# Patient Record
Sex: Male | Born: 1935 | Race: White | Hispanic: No | Smoking: Former smoker
Health system: Southern US, Community
[De-identification: ages and names within clinical notes are randomized; demographics above are authoritative.]

## PROBLEM LIST (undated history)

## (undated) DIAGNOSIS — I2510 Atherosclerotic heart disease of native coronary artery without angina pectoris: Secondary | ICD-10-CM

## (undated) DIAGNOSIS — R319 Hematuria, unspecified: Secondary | ICD-10-CM

## (undated) DIAGNOSIS — I219 Acute myocardial infarction, unspecified: Secondary | ICD-10-CM

## (undated) DIAGNOSIS — N400 Enlarged prostate without lower urinary tract symptoms: Secondary | ICD-10-CM

## (undated) DIAGNOSIS — E785 Hyperlipidemia, unspecified: Secondary | ICD-10-CM

## (undated) DIAGNOSIS — K219 Gastro-esophageal reflux disease without esophagitis: Secondary | ICD-10-CM

## (undated) DIAGNOSIS — R339 Retention of urine, unspecified: Secondary | ICD-10-CM

## (undated) DIAGNOSIS — M810 Age-related osteoporosis without current pathological fracture: Secondary | ICD-10-CM

## (undated) DIAGNOSIS — J449 Chronic obstructive pulmonary disease, unspecified: Secondary | ICD-10-CM

## (undated) DIAGNOSIS — S7292XA Unspecified fracture of left femur, initial encounter for closed fracture: Secondary | ICD-10-CM

## (undated) DIAGNOSIS — R972 Elevated prostate specific antigen [PSA]: Secondary | ICD-10-CM

## (undated) DIAGNOSIS — I10 Essential (primary) hypertension: Secondary | ICD-10-CM

## (undated) HISTORY — DX: Retention of urine, unspecified: R33.9

## (undated) HISTORY — DX: Acute myocardial infarction, unspecified: I21.9

## (undated) HISTORY — PX: CORONARY ANGIOPLASTY: SHX604

## (undated) HISTORY — DX: Benign prostatic hyperplasia without lower urinary tract symptoms: N40.0

## (undated) HISTORY — DX: Chronic obstructive pulmonary disease, unspecified: J44.9

## (undated) HISTORY — DX: Hyperlipidemia, unspecified: E78.5

## (undated) HISTORY — PX: HERNIA REPAIR: SHX51

## (undated) HISTORY — PX: ELBOW SURGERY: SHX618

## (undated) HISTORY — PX: APPENDECTOMY: SHX54

## (undated) HISTORY — PX: OTHER SURGICAL HISTORY: SHX169

## (undated) HISTORY — DX: Unspecified fracture of left femur, initial encounter for closed fracture: S72.92XA

## (undated) HISTORY — DX: Elevated prostate specific antigen (PSA): R97.20

## (undated) HISTORY — DX: Hematuria, unspecified: R31.9

## (undated) HISTORY — DX: Age-related osteoporosis without current pathological fracture: M81.0

## (undated) HISTORY — PX: CARDIAC CATHETERIZATION: SHX172

## (undated) HISTORY — DX: Atherosclerotic heart disease of native coronary artery without angina pectoris: I25.10

## (undated) HISTORY — DX: Gastro-esophageal reflux disease without esophagitis: K21.9

---

## 2004-01-22 HISTORY — PX: HIP SURGERY: SHX245

## 2004-07-26 ENCOUNTER — Ambulatory Visit: Payer: Self-pay | Admitting: General Practice

## 2004-08-09 ENCOUNTER — Ambulatory Visit: Payer: Self-pay | Admitting: Surgery

## 2004-08-09 ENCOUNTER — Other Ambulatory Visit: Payer: Self-pay

## 2004-08-17 ENCOUNTER — Ambulatory Visit: Payer: Self-pay | Admitting: Surgery

## 2004-09-27 ENCOUNTER — Inpatient Hospital Stay: Payer: Self-pay | Admitting: General Practice

## 2004-10-03 ENCOUNTER — Encounter: Payer: Self-pay | Admitting: General Practice

## 2004-10-21 ENCOUNTER — Encounter: Payer: Self-pay | Admitting: General Practice

## 2006-03-23 IMAGING — CR DG CHEST 2V
1 series · 2 of 2 positions shown · non-contrast
Comparison: none

REASON FOR EXAM: v13.27 pre op, 550.91 recurr inguinal hernia
COMMENTS:

[Series 743: postero_anterior · 0.11mm/px · 2 of 2 slices shown]
[im 1/2]
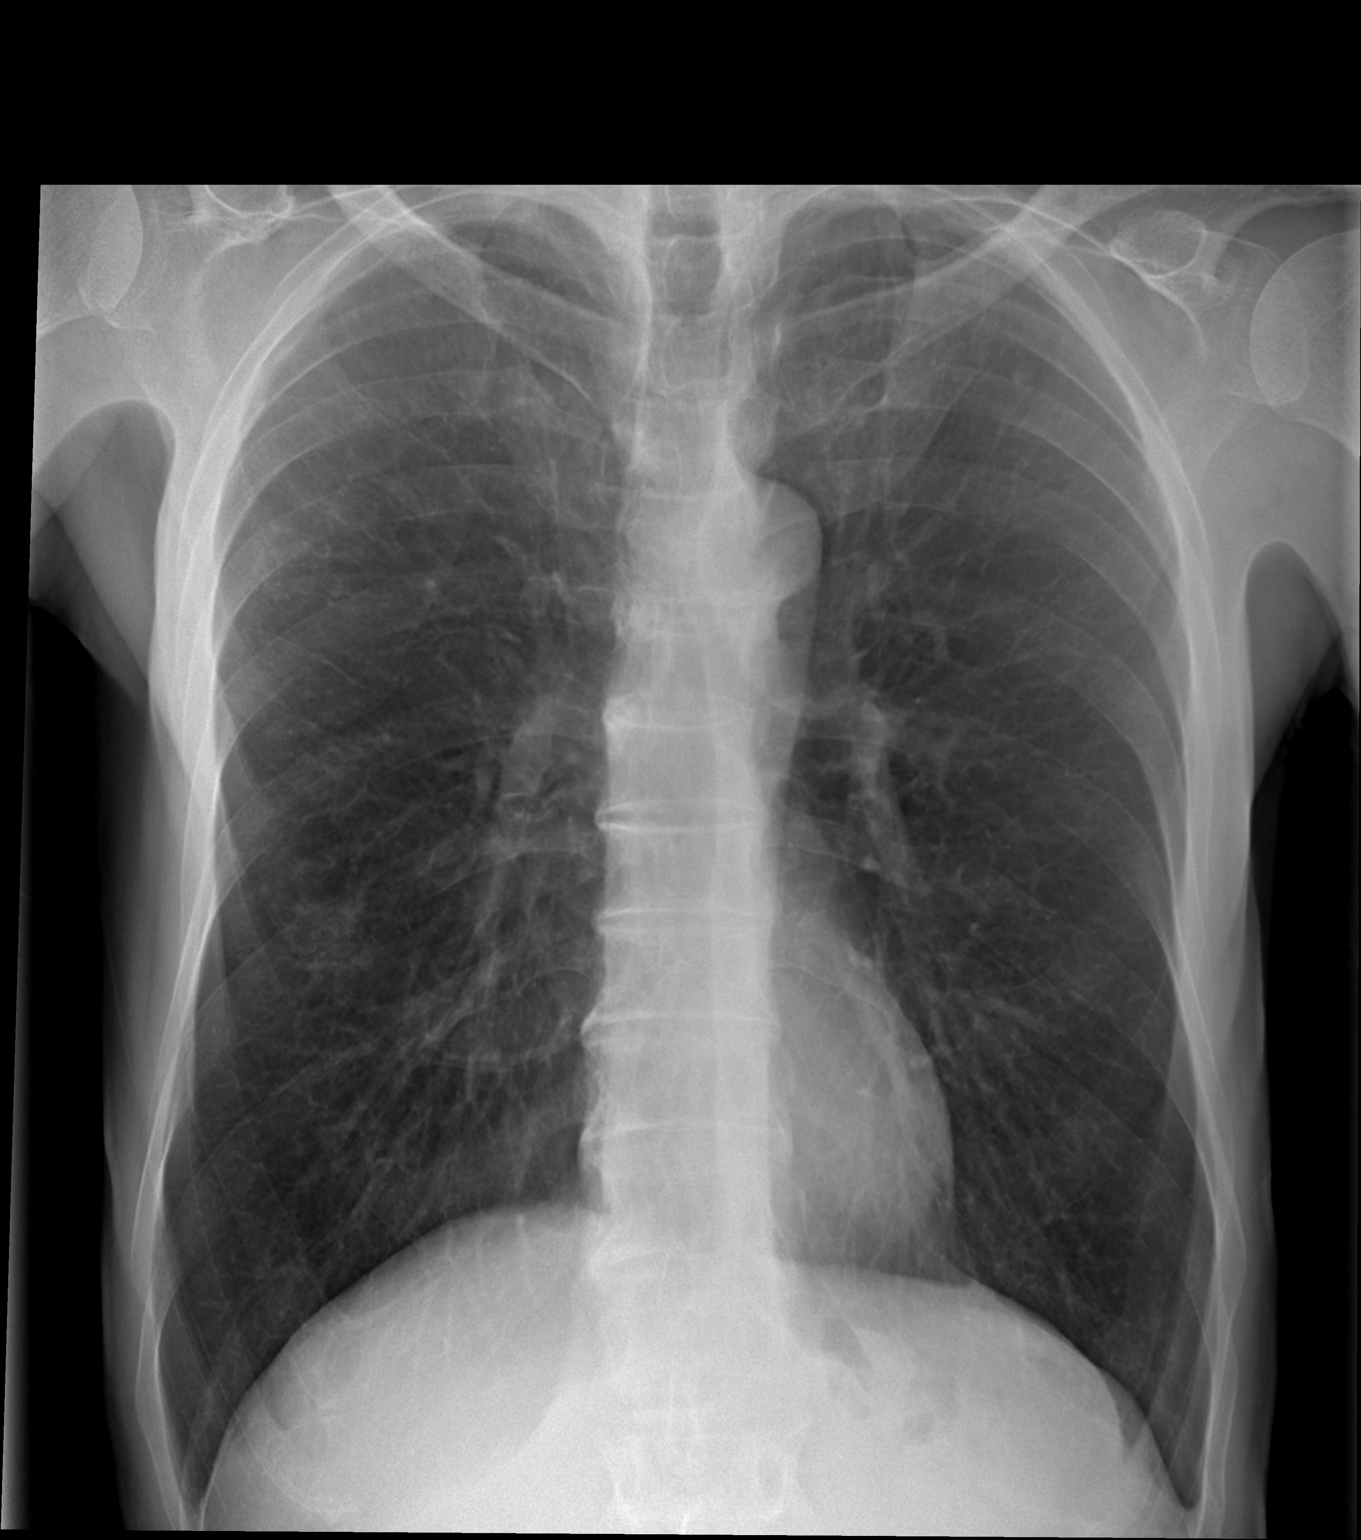
[im 2/2]
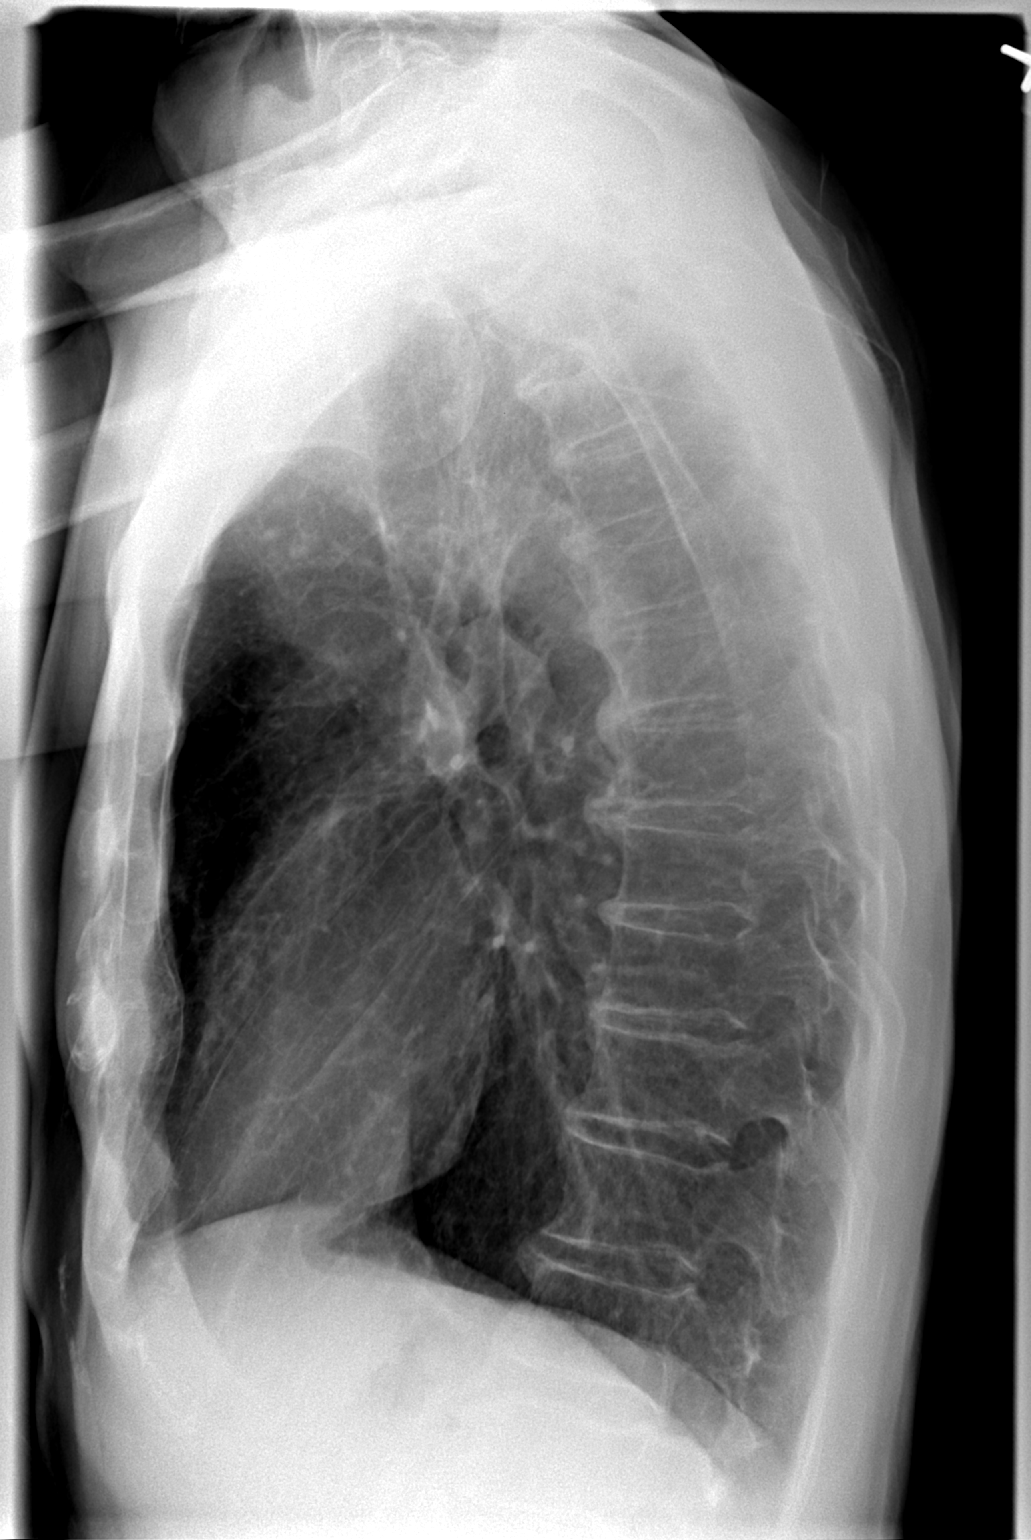

[2 of 2 positions shown; findings below may reference images not displayed]

PROCEDURE:     DXR - DXR CHEST PA (OR AP) AND LATERAL  - August 09, 2004  [DATE]

RESULT:     The patient shows evidence of underlying COPD and interstitial
changes.  The lungs are clear without evidence of infiltrates, effusions, or
masses.  Extensive degenerative bony changes are noted throughout the
thoracic spine.
IMPRESSION: COPD without superimposed acute abnormalities.

## 2010-10-21 ENCOUNTER — Emergency Department: Payer: Self-pay | Admitting: Emergency Medicine

## 2011-01-07 DIAGNOSIS — I2510 Atherosclerotic heart disease of native coronary artery without angina pectoris: Secondary | ICD-10-CM | POA: Insufficient documentation

## 2011-01-07 DIAGNOSIS — Z96649 Presence of unspecified artificial hip joint: Secondary | ICD-10-CM | POA: Insufficient documentation

## 2011-01-07 DIAGNOSIS — E785 Hyperlipidemia, unspecified: Secondary | ICD-10-CM | POA: Insufficient documentation

## 2013-02-20 ENCOUNTER — Inpatient Hospital Stay: Payer: Self-pay | Admitting: Internal Medicine

## 2013-02-20 ENCOUNTER — Ambulatory Visit: Payer: Self-pay | Admitting: Student

## 2013-02-20 LAB — URINALYSIS, COMPLETE
BILIRUBIN, UR: NEGATIVE
BLOOD: NEGATIVE
Bacteria: NONE SEEN
Glucose,UR: NEGATIVE mg/dL (ref 0–75)
Ketone: NEGATIVE
Leukocyte Esterase: NEGATIVE
Nitrite: NEGATIVE
PROTEIN: NEGATIVE
Ph: 7 (ref 4.5–8.0)
RBC,UR: <1 /HPF (ref 0–5)
Specific Gravity: 1.01 (ref 1.003–1.030)
Squamous Epithelial: NONE SEEN
WBC UR: <1 /HPF (ref 0–5)

## 2013-02-20 LAB — APTT: ACTIVATED PTT: 33.9 s (ref 23.6–35.9)

## 2013-02-20 LAB — BASIC METABOLIC PANEL
ANION GAP: 5 — AB (ref 7–16)
BUN: 12 mg/dL (ref 7–18)
CREATININE: 1.22 mg/dL (ref 0.60–1.30)
Calcium, Total: 8.2 mg/dL — ABNORMAL LOW (ref 8.5–10.1)
Chloride: 102 mmol/L (ref 98–107)
Co2: 30 mmol/L (ref 21–32)
EGFR (African American): >60
EGFR (Non-African Amer.): 53 — ABNORMAL LOW
Glucose: 87 mg/dL (ref 65–99)
OSMOLALITY: 273 (ref 275–301)
Potassium: 4.1 mmol/L (ref 3.5–5.1)
SODIUM: 137 mmol/L (ref 136–145)

## 2013-02-20 LAB — CBC
HCT: 37.7 % — ABNORMAL LOW (ref 40.0–52.0)
HGB: 12.7 g/dL — ABNORMAL LOW (ref 13.0–18.0)
MCH: 30.8 pg (ref 26.0–34.0)
MCHC: 33.7 g/dL (ref 32.0–36.0)
MCV: 91 fL (ref 80–100)
PLATELETS: 156 10*3/uL (ref 150–440)
RBC: 4.13 10*6/uL — ABNORMAL LOW (ref 4.40–5.90)
RDW: 12.3 % (ref 11.5–14.5)
WBC: 14.8 10*3/uL — ABNORMAL HIGH (ref 3.8–10.6)

## 2013-02-20 LAB — PROTIME-INR
INR: 1.1
Prothrombin Time: 13.6 secs (ref 11.5–14.7)

## 2013-02-21 ENCOUNTER — Ambulatory Visit: Payer: Self-pay | Admitting: Urology

## 2013-02-21 LAB — CBC WITH DIFFERENTIAL/PLATELET
BASOS ABS: 0.1 10*3/uL (ref 0.0–0.1)
Basophil %: 0.5 %
EOS ABS: 0.2 10*3/uL (ref 0.0–0.7)
Eosinophil %: 1.7 %
HCT: 41.7 % (ref 40.0–52.0)
HGB: 14 g/dL (ref 13.0–18.0)
LYMPHS ABS: 1.2 10*3/uL (ref 1.0–3.6)
LYMPHS PCT: 9.1 %
MCH: 30.9 pg (ref 26.0–34.0)
MCHC: 33.7 g/dL (ref 32.0–36.0)
MCV: 92 fL (ref 80–100)
MONO ABS: 1.1 x10 3/mm — AB (ref 0.2–1.0)
MONOS PCT: 8.4 %
Neutrophil #: 10.8 10*3/uL — ABNORMAL HIGH (ref 1.4–6.5)
Neutrophil %: 80.3 %
PLATELETS: 189 10*3/uL (ref 150–440)
RBC: 4.53 10*6/uL (ref 4.40–5.90)
RDW: 12.3 % (ref 11.5–14.5)
WBC: 13.4 10*3/uL — ABNORMAL HIGH (ref 3.8–10.6)

## 2013-02-22 LAB — BASIC METABOLIC PANEL
Anion Gap: 5 — ABNORMAL LOW (ref 7–16)
BUN: 15 mg/dL (ref 7–18)
CALCIUM: 8.2 mg/dL — AB (ref 8.5–10.1)
CREATININE: 1.21 mg/dL (ref 0.60–1.30)
Chloride: 104 mmol/L (ref 98–107)
Co2: 27 mmol/L (ref 21–32)
EGFR (African American): >60
EGFR (Non-African Amer.): 54 — ABNORMAL LOW
GLUCOSE: 108 mg/dL — AB (ref 65–99)
Osmolality: 273 (ref 275–301)
Potassium: 3.7 mmol/L (ref 3.5–5.1)
Sodium: 136 mmol/L (ref 136–145)

## 2013-02-22 LAB — HEMOGLOBIN: HGB: 11.6 g/dL — ABNORMAL LOW (ref 13.0–18.0)

## 2013-02-23 LAB — HEMOGLOBIN: HGB: 12.1 g/dL — ABNORMAL LOW (ref 13.0–18.0)

## 2014-03-15 ENCOUNTER — Emergency Department: Payer: Self-pay | Admitting: Internal Medicine

## 2014-05-14 NOTE — Discharge Summary (Signed)
PATIENT NAME:  Eric [name], Eric [name] MR#:  [id number] DATE OF BIRTH:  09-Jun-1923  DATE OF ADMISSION:  02/20/2013 DATE OF DISCHARGE:  02/23/2013    For [name] detailed note, please take [name] look at the history and physical done on admission by Dr. Imogene Burn.   DIAGNOSES AT DISCHARGE:  1.  Status post fall and oblique fracture of the left femur which is nonoperable.  2.  Urinary retention with hematuria status post endoscopic Foley catheter placement.  3.  Hypertension.  4.  Hyperlipidemia.  5.  Gastroesophageal reflux disease.   DIET: The patient is being discharged on [name] low-sodium, low-fat diet.   ACTIVITY: As tolerated.   FOLLOWUP: Dr. Trey Paula from urology in the next 1 week.   DISCHARGE MEDICATIONS: Lisinopril 5 mg daily, sublingual nitroglycerin as needed, metoprolol succinate 50 mg daily, Lipitor 20 mg daily, aspirin 81 mg daily, Norco 5/325, one to 2 tabs q.4-6 hours as needed, tramadol 50 mg one to 2 tabs q.4 hours as needed, enoxaparin  40 mg subcu daily x14 days, Tylenol 325 mg two tabs q.4 hours as needed, Senokot 1 tab b.i.d., Protonix 40 mg daily, ciprofloxacin 250 mg b.i.d. x5 days, and Flomax 0.4 mg daily.   CONSULTANTS DURING HOSPITAL COURSE: Dr. Adaline Sill from urology, Dr. Early Chars from orthopedics.   PERTINENT STUDIES DONE DURING THE HOSPITAL COURSE: An x-ray of the left femur done on 02/20/2013 showing osseous demineralization with postsurgical changes of the left total hip arthroplasty, nondisplaced, oblique fracture of the proximal left femur just below the level of the lesser trochanter.   [name] chest x-ray done on admission showing no acute disease.   HOSPITAL COURSE: This is an 81 year old male with medical problems as mentioned above who presented to the hospital on 02/20/2013 secondary to [name] fall and noted to have left hip pain.   1.  Status post fall and left hip pain. The patient had an x-ray on admission which showed an oblique fracture of the left hip. The patient was seen by  orthopedics who did not think that the patient needed surgical intervention but likely pain control and physical therapy. The patient has tolerated physical therapy here. They recommend short-term rehab which is where he is currently being discharged to. The patient will continue pain control for his hip pain with some Norco and tramadol as needed.  2.  Urinary retention. The patient presented to the hospital and [name] day after admission was having significant lower abdominal pain and was noted to have urinary retention. Multiple attempts were made to place [name] Foley catheter although it was not possible. [name] urology consult was obtained. The patient was seen by Dr. Selena Batten from urology. The patient underwent [name] cystoscopy and endoscopic Foley catheter placement which was on February 1. The patient's urine has been bloody since then although his hemoglobin has remained stable. At present, the patient is being discharged with his Foley to the skilled nursing facility. He should have [name] follow-up with [name]  urologist as an outpatient. He is being made [name] referral with Dr. Edwyna Shell. The patient likely has underlying benign prostatic hypertrophy. Therefore, he is being discharged on some Flomax and empiric antibiotics with Cipro for the next 5 days.  3.  Hypertension. The patient remained hemodynamically stable on his metoprolol and lisinopril. He will resume that.  4.  Hyperlipidemia. The patient was maintained on his Lipitor and he will resume that.  5.  Hematuria. This was likely secondary to urinary retention and benign prostatic hypertrophy. His  hemoglobin has remained stable. This further needs to be followed with urology as an outpatient.   CODE STATUS: FULL CODE.   DISPOSITION: He is being discharged to [name] skilled nursing facility for ongoing care.   TIME SPENT: 40 minutes.   ____________________________ Rolly PancakeVivek J. Cherlynn KaiserSainani, MD vjs:np D: 02/23/2013 14:41:43 ET T: 02/23/2013 15:01:51 ET JOB#: [id number]  cc: Rolly PancakeVivek J.  Cherlynn KaiserSainani, MD, <Dictator> Richard D. Edwyna ShellHart, DO [place] SirenVIVEK J Angellica Maddison MD ELECTRONICALLY SIGNED 02/25/2013 11:51

## 2014-05-14 NOTE — Op Note (Signed)
PATIENT NAME:  Eric [name], Eric [name] MR#:  [id number] DATE OF BIRTH:  1923/09/21  DATE OF PROCEDURE:  02/21/2013  PREOPERATIVE DIAGNOSES: 1.  Urinary retention. 2.  Benign prostatic hyperplasia. 3.  Urethral stricture.  POSTOPERATIVE DIAGNOSES: 1.  Urinary retention.  2.  Benign prostatic hyperplasia.  3.  Urethral stricture.   PROCEDURES: 1.  Flexible cystourethroscopy with endoscopic-assisted Foley catheter placement.  2.  Bladder irrigation.   ANESTHESIA:  Intravenous anesthesia.   SURGEON: Marin OlpJay H. Karne Ozga, M.D.   ESTIMATED BLOOD LOSS: Minimal.   COMPLICATIONS: None.   DRAINS: [name] 16 [place] catheter to straight drainage.    PATHOLOGY SPECIMENS:  None.   DESCRIPTION OF PROCEDURE: The patient was brought to the cystoscopy suite and after intravenous anesthesia was established, the patient was maintained in the supine position, given his nondisplaced left proximal femur fracture. The patient was then prepped and draped in the usual sterile fashion. The 16 French flexible cystoscope was then inserted per urethra utilizing normal saline as the irrigant. There was [name] mild bulbar urethral stricture appreciated. The flexible scope cystoscope was able to be advanced through the stricture and the membranous urethra into the prostate fossa. There was severe occlusive bilobar prostatic enlargement with [name] tortuous prostate fossa, along with elevation of the prostate floor and bladder neck. The flexible cystoscope was able to be successfully negotiated through the tortuous prostatic fossa into the bladder lumen. Grossly bloody urine was appreciated within the bladder lumen and was enough to obscure complete visualization of the interior of the bladder. [name] super stiff guidewire was then advanced through the flexible cystoscope and into the bladder lumen. The flexible cystoscope was then withdrawn. [name] 16 [place] catheter was then advanced over the super stiff guidewire and into the bladder lumen without  difficulty. The bladder was inflated with 15 mL of  sterile water. The super stiff guidewire was withdrawn. The Foley catheter was placed to straight drainage. After an initial 100 mL of grossly bloody urine was drained, the catheter stopped draining. As such, the Foley catheter was then irrigated, retrieving [name] clot from the bladder. [name] total of 300 mL of sterile water irrigation was required to completely evacuate all clots in the bladder. The bladder effluent was light pink after irrigation, consistent with the absence of any significant ongoing hematuria. As such, the catheter was placed to straight drainage and an additional 300 mL of urine drained. The patient was then transferred to the postanesthesia care unit in stable condition, having tolerated the procedure well.   RECOMMENDATIONS: 1.  Keep the urethral Foley catheter for [name] minimum of one week and until the patient is fully ambulatory.  2.  Upon discussion with the patient's daughter, the [facility], having seen Dr. Leonette MonarchHarman approximately two years ago. [facility].     ____________________________ Marin OlpJay H. Ketara Cavness, MD jhk:NTS D: 02/21/2013 05:37:06 ET T: 02/21/2013 06:08:07 ET JOB#: [id number]  cc: Marin OlpJay H. Briany Aye, MD, <Dictator> Marin OlpJAY H Zenia Guest MD ELECTRONICALLY SIGNED 03/12/2013 22:35

## 2014-05-14 NOTE — Consult Note (Signed)
Admit Reason:   Hip pain (719.45): Status: Active, Coding System: ICD9, Coded Name: Pain in joint, pelvic region and thigh    osteoporosis:   Home Medications: Medication Instructions Status  lisinopril 5 mg oral tablet 1 tab(s) orally once a day Active  nitroglycerin 0.4 mg sublingual tablet 1 tab(s) sublingual every 5 minutes, As Needed - for Chest Pain Active  aspirin 81 mg oral tablet 1 tab(s) orally once a day Active  Metoprolol Succinate ER 50 mg oral tablet, extended release 1 tab(s) orally once a day (in the morning) Active   Lab Results:  Routine Chem:  31-Jan-15 13:50   Glucose, Serum 87  BUN 12  Creatinine (comp) 1.22  Potassium, Serum 4.1  Chloride, Serum 102  CO2, Serum 30  Calcium (Total), Serum  8.2  Anion Gap  5  Osmolality (calc) 273  eGFR (African American) >60  eGFR (Non-African American)  53 (eGFR values <72m/min/1.73 m2 may be an indication of chronic kidney disease (CKD). Calculated eGFR is useful in patients with stable renal function. The eGFR calculation will not be reliable in acutely ill patients when serum creatinine is changing rapidly. It is not useful in  patients on dialysis. The eGFR calculation may not be applicable to patients at the low and high extremes of body sizes, pregnant women, and vegetarians.)  Routine Coag:  31-Jan-15 13:50   Prothrombin 13.6  INR 1.1 (INR reference interval applies to patients on anticoagulant therapy. A single INR therapeutic range for coumarins is not optimal for all indications; however, the suggested range for most indications is 2.0 - 3.0. Exceptions to the INR Reference Range may include: Prosthetic heart valves, acute myocardial infarction, prevention of myocardial infarction, and combinations of aspirin and anticoagulant. The need for a higher or lower target INR must be assessed individually. Reference: The Pharmacology and Management of the Vitamin K  antagonists: the seventh ACCP Conference  on Antithrombotic and Thrombolytic Therapy. CASNKN.3976Sept:126 (3suppl): [id number] A HCT value >55% may artifactually increase the PT.  In one study,  the increase was an average of 25%. Reference:  "Effect on Routine and Special Coagulation Testing Values of Citrate Anticoagulant Adjustment in Patients with High HCT Values." American Journal of Clinical Pathology 2006;126:400-405.)  Activated PTT (APTT) 33.9 (A HCT value >55% may artifactually increase the APTT. In one study, the increase was an average of 19%. Reference: "Effect on Routine and Special Coagulation Testing Values of Citrate Anticoagulant Adjustment in Patients with High HCT Values." American Journal of Clinical Pathology 2006;126:400-405.)  Routine Hem:  31-Jan-15 13:50   WBC (CBC)  14.8  RBC (CBC)  4.13  Hemoglobin (CBC)  12.7  Hematocrit (CBC)  37.7  Platelet Count (CBC) 156 (Result(s) reported on 20 Feb 2013 at 02:16PM.)  MCV 91  MCH 30.8  MCHC 33.7  RDW 12.3   Radiology Results:  Radiology Results: XRay:    31-Jan-15 11:53, Femur Left  Femur Left  REASON FOR EXAM:    Fall, pain  COMMENTS:       PROCEDURE: DXR - DXR FEMUR LEFT  - Feb 20 2013 11:53AM     CLINICAL DATA:  FGolden Circleyesterday, continued pain with weight-bearing  on left side    EXAM:  LEFT FEMUR - 2 VIEW    COMPARISON:  None    FINDINGS:  Visualized left hemipelvis appears intact.  Components of left hip prosthesis in expected positions.    Diffuse osseous demineralization.    Degenerative changes right knee with joint space narrowing.  Nondisplaced oblique fracture at proximal left femur just below the  level lesser trochanter, visualized only on frog-leg lateral view.    No additional fracture or dislocation.     IMPRESSION:  Osseous demineralization with postsurgical changes of left total hip  arthroplasty.  Nondisplaced oblique fracture proximal left femur just below the  level of the lesser  trochanter.      Electronically Signed    By: Lavonia Dana M.D.    On: 02/20/2013 12:04         Verified By: Burnetta Sabin, M.D.,    31-Jan-15 11:53, Pelvis AP Only  Pelvis AP Only  REASON FOR EXAM:    Fall  COMMENTS:       PROCEDURE: DXR - DXR PELVIS AP ONLY  - Feb 20 2013 11:53AM     CLINICAL DATA:  [place] yesterday, continued pain with weight-bearing  especially left side    EXAM:  PELVIS - 1-2 VIEW    COMPARISON:  None    FINDINGS:  Left hip prosthesis with components in expected positions.  Degenerative changes right hip with joint space narrowing and spur  formation.    Osseous demineralization.    No acute fracture, dislocation or bone destruction.     IMPRESSION:  Osseous demineralization.    Left hip prosthesis and degenerative changes right hip.    No acute abnormalities.    Electronically Signed    By: Lavonia Dana M.D.    On: 02/20/2013 12:00         Verified By: Burnetta Sabin, M.D.,  LabUnknown:    31-Jan-15 11:53, Femur Left  PACS Image    31-Jan-15 11:53, Pelvis AP Only  PACS Image    Eggs: Unknown   General Aspect 81 y/o male patient in no acute distress.   Present Illness 81 y/o caucasian male who fell in yard earlier today. Noted immediate pain, states it took him some time to get up but was able to ambulate post fall. Pain radiating down femur. Only bothers him with movement and weight-bearing, minimal to no pain at rest.   Case History and Physical Exam:  Chief Complaint hip pain after fall   Past Medical Health Coronary Artery Disease, Hypertension, COPD, osteoporosis   Past Surgical History Appendectomy   Family History Non-Contributory   HEENT EOM intact, head atraumatic, wears glasses   Neck/Nodes Supple   Chest/Lungs No use of accessory muscles, non-labored breathing   Breasts Not examined   Cardiovascular Normal Sinus Rhythm   Abdomen Benign   Genitalia Not examined   Rectal Not examined   Musculoskeletal TTP  greater troch area, pain with hip ROM. SILT DP/SP/sural/saphenous distribution. EHL/FHL/GCS/TA intact. Extremity  WWP, good distal pulses.  No open wounds, well healed scar at hip from prior surgery.   Neurological Grossly WNL   Skin Warm  Dry    Impression 81 y/o white male after fall in yard with non-dispalced periprosthetic proximal femur fracture   Plan Fully-coated hip stem without evidence of pedestal formation but some radiolucent lines around stem. No evidence of obvious stem subsidence. Non-displaced proximal femur frature just distal to greater trochanter.   - will attempt non-operative treatment - HIP ABDUCTION RESTRICTIONS and TOUCH DOWN WEIGHTBEARING, if he is unable to follow will need abduction brace - pain control - mobilize with PT as tolerated, needs DME assessment and walker - will need endocrinology work-up for osteoporosis and appropriate management by endocrinology - would start on Vitamin D and Calcium citrate today -  may need SNF placement - care plan discussed with family at bedside, reviewed xrays together - Lovenox for DVT ppx  - will discuss with Dr. Marry Guan who did primary surgery   Electronic Signatures: Harle Battiest (MD)  (Signed 31-Jan-15 16:18)  Authored: Health Issues, Significant Events - History, Home Medications, Labs, Radiology Results, Allergies, General Aspect/Present Illness, History and Physical Exam, Impression/Plan   Last Updated: 31-Jan-15 16:18 by Harle Battiest (MD)

## 2014-05-14 NOTE — H&P (Signed)
PATIENT NAME:  Eric Eric, Eric Eric MR#:  [id number] DATE OF BIRTH:  04/09/1923  DATE OF ADMISSION:  02/20/2013  PRIMARY CARE PHYSICIAN:  None local.  REFERRING PHYSICIAN:  Dr. Scotty [name].   CHIEF COMPLAINT:  Fall by accident today with left hip pain.   HISTORY OF PRESENT ILLNESS:  An 81 year old Caucasian male with Eric history of hypertension, COPD, osteoporosis, presented in the ED with above chief complaint. The patient fell by accident early this morning, complains of left hip pain, unable to bear weight.   The patient denies any syncope, loss of consciousness. No headache or dizziness. Denies any other injury. The patient's only complains of mild cough recently, denies any chest pain, palpitations, orthopnea, nocturnal dyspnea. No  nausea, vomiting, diarrhea. No urine problem.   Dr. Scotty [name] talked with Dr. Ernest [name], who did Eric left hip arthropathy in 2006. Dr. Ernest [name] suggested to admit the patient, got pain control, Physical Therapy evaluation and the patient needs rehab replacement.   PAST MEDICAL HISTORY:  1.  Hypertension.  2.  COPD.  3.  Osteoporosis. 4.  CAD status post Eric stent   PAST SURGICAL HISTORY:  Status post left hip surgery in 2006.   SOCIAL HISTORY:  Smokes Eric pipe occasionally, no alcohol drinking or illicit drugs.   FAMILY HISTORY:  Father had Eric heart attack. Mother had Eric stroke.   ALLERGIES:  EGGS.   HOME MEDICATIONS: 1.  Nitroglycerin 0.4 mg sublingual 1 tab every 5 minutes p.r.n. for chest pain.  2.  Lopressor 50 mg p.o. daily.  3.  Lisinopril 5 mg p.o. daily.  4.  Aspirin 81 mg p.o. daily.   REVIEW OF SYSTEMS:  CONSTITUTIONAL:  Denies any fever or chills. No headache or dizziness. No weakness. No weight gain, weight loss.  EYES:  No double vision or blurred vision,  ENT:  No postnasal drip, slurred speech or dysphagia.  CARDIOVASCULAR:  No chest pain, palpitation, orthopnea, nocturnal dyspnea. No leg edema.  PULMONARY:  Mild cough, no shortness of breath, wheezing.  No hemoptysis.  GASTROINTESTINAL:  No abdominal pain, nausea, vomiting or diarrhea. No melena or bloody stool. GENITOURINARY:  No dysuria, hematuria or incontinence.  SKIN:  No rash or jaundice.  NEUROLOGIC:  Eric and O.  HEMATOLOGY:  No easy bruising or bleeding.  ENDOCRINE:  No polyuria, polydipsia, heat or cold intolerance.   PHYSICAL EXAMINATION:  VITAL SIGNS:  Temperature 99.3, blood pressure 141/63, pulse 90, O2 saturation 96% in room air.  GENERAL:  Alert, awake, oriented, in no acute distress.  HEENT:  Pupils round, equal and reactive to light and accommodation. Moist oral mucosa. Clear oropharynx.  NECK:  Supple. No JVD or carotid bruit. No lymphadenopathy. No thyromegaly.  CARDIOVASCULAR:  S1, S2 regular rate and rhythm. No murmurs, gallop,  PULMONARY:  Bilateral air entry. No wheezing or rales. No use of accessory muscle to breathe.  ABDOMEN:  Soft. No distention or tenderness. No organomegaly. Bowel sounds present.  EXTREMITIES:  No edema, clubbing or cyanosis. No calf tenderness. Left hip tenderness, unable to move left lower extremity.  SKIN:  No rash or jaundice.  NEUROLOGIC:  Eric and O x 3. No focal deficit. Power 5/5. Sensation intact.   LABORATORY DATA:  Chest x-ray:  No active disease. Glucose 87, BUN 12, creatinine 1.22, sodium 137, potassium 4.1, chloride 102. WBC 14.8, hemoglobin 12.7, platelets 156. Left femur x-ray shows status post surgical changes of the left total hip arthroplasty, nondisplaced oblique fracture. AP x-ray showed left hip prothesis, degenerative  changes on the right hip.   IMPRESSIONS: 1.  Left hip fracture.  2.  Fall. 3.  Hypertension.  4.  Chronic obstructive pulmonary disease.  5.  Osteoporosis.  6.  Coronary artery disease.   PLAN OF TREATMENT:  1.  The patient will be admitted to Eric [facility]. We will start pain control, physical therapy evaluation and fall precautions. We will get an orthopaedic surgery consult from Dr. Ernest PineHooten.  2.  We  will continue the patient's home hypertension medication, Lopressor and lisinopril and continue aspirin.   I discussed the patient's condition and plan of treatment with the patient, the patient's daughter.   CODE STATUS:  The patient wants FULL CODE.   TIME SPENT: About 50 minutes   ____________________________ Eric PollackQing Tymeka Privette, MD qc:jm D: 02/20/2013 14:49:31 ET T: 02/20/2013 15:20:24 ET JOB#: [id number]  cc: Eric PollackQing Ghina Bittinger, MD, <Dictator> Eric PollackQING Eric Canner MD ELECTRONICALLY SIGNED 02/21/2013 15:29

## 2014-05-14 NOTE — Consult Note (Signed)
Chief Complaint:  Subjective/Chief Complaint Urology F/U - Post-op Check s/p Flexible Cysto/Endoscopic-assisted Foley Placement  Pt sleeping comfortably - arousable - denies any pain or discomfort.   VITAL SIGNS/ANCILLARY NOTES: **Vital Signs.:   01-Feb-15 09:41  Temperature Temperature (F) 97.7  Celsius 36.5  Temperature Source oral  Pulse Pulse 83  Respirations Respirations 22  Systolic BP Systolic BP 578  Diastolic BP (mmHg) Diastolic BP (mmHg) 65  Mean BP 77  Pulse Ox % Pulse Ox % 98  Pulse Ox Activity Level  At rest  Oxygen Delivery 2L  *Intake and Output.:   Daily 01-Feb-15 07:00  Grand Totals Intake:  250 Output:  525    Net:  -42 24 Hr.:  -275  Oral Intake      In:  0  Total Intake per OR/Specials/[facility]      In:  250  Urine ml     Out:  100  EBL ml     Out:  25  Total Output per OR/Specials/[facility] ml     Out:  400  Length of Stay Totals Intake:  250 Output:  525    Net:  -275   Brief Assessment:  GEN well developed, no acute distress, thin, sleeping comfortably - arousable, responds to questions appropriately   Additional Physical Exam Foley catheter in place - draining clear amber urine. Foreskin noted to be retracted behind the glans - reduced without difficulty.   Lab Results: Routine BB:  31-Jan-15 13:50   ABO Group + Rh Type B Positive  Antibody Screen NEGATIVE (Result(s) reported on 20 Feb 2013 at 03:15PM.)  Routine Chem:  31-Jan-15 13:50   Glucose, Serum 87  BUN 12  Creatinine (comp) 1.22  Sodium, Serum 137  Potassium, Serum 4.1  Chloride, Serum 102  CO2, Serum 30  Calcium (Total), Serum  8.2  Anion Gap  5  Osmolality (calc) 273  eGFR (African American) >60  eGFR (Non-African American)  53 (eGFR values <35m/min/1.73 m2 may be an indication of chronic kidney disease (CKD). Calculated eGFR is useful in patients with stable renal function. The eGFR calculation will not be reliable in acutely ill patients when serum creatinine  is changing rapidly. It is not useful in  patients on dialysis. The eGFR calculation may not be applicable to patients at the low and high extremes of body sizes, pregnant women, and vegetarians.)  Routine UA:  31-Jan-15 19:53   Color (UA) Yellow  Clarity (UA) Clear  Glucose (UA) Negative  Bilirubin (UA) Negative  Ketones (UA) Negative  Specific Gravity (UA) 1.010  Blood (UA) Negative  pH (UA) 7.0  Protein (UA) Negative  Nitrite (UA) Negative  Leukocyte Esterase (UA) Negative (Result(s) reported on 20 Feb 2013 at 08:09PM.)  RBC (UA) <1 /HPF  WBC (UA) <1 /HPF  Bacteria (UA) NONE SEEN  Epithelial Cells (UA) NONE SEEN  Mucous (UA) PRESENT (Result(s) reported on 20 Feb 2013 at 08:09PM.)  Routine Coag:  31-Jan-15 13:50   Prothrombin 13.6  INR 1.1 (INR reference interval applies to patients on anticoagulant therapy. A single INR therapeutic range for coumarins is not optimal for all indications; however, the suggested range for most indications is 2.0 - 3.0. Exceptions to the INR Reference Range may include: Prosthetic heart valves, acute myocardial infarction, prevention of myocardial infarction, and combinations of aspirin and anticoagulant. The need for a higher or lower target INR must be assessed individually. Reference: The Pharmacology and Management of the Vitamin K  antagonists: the seventh ACCP Conference on Antithrombotic  and Thrombolytic Therapy. KYHCW.2376 Sept:126 (3suppl): [id number]. A HCT value >55% may artifactually increase the PT.  In one study,  the increase was an average of 25%. Reference:  "Effect on Routine and Special Coagulation Testing Values of Citrate Anticoagulant Adjustment in Patients with High HCT Values." American Journal of Clinical Pathology 2006;126:400-405.)  Activated PTT (APTT) 33.9 (A HCT value >55% may artifactually increase the APTT. In one study, the increase was an average of 19%. Reference: "Effect on Routine and Special Coagulation  Testing Values of Citrate Anticoagulant Adjustment in Patients with High HCT Values." American Journal of Clinical Pathology 2006;126:400-405.)  Routine Hem:  31-Jan-15 13:50   WBC (CBC)  14.8  RBC (CBC)  4.13  Hemoglobin (CBC)  12.7  Hematocrit (CBC)  37.7  Platelet Count (CBC) 156 (Result(s) reported on 20 Feb 2013 at 02:16PM.)  MCV 91  MCH 30.8  MCHC 33.7  RDW 12.3  01-Feb-15 04:11   WBC (CBC)  13.4  RBC (CBC) 4.53  Hemoglobin (CBC) 14.0  Hematocrit (CBC) 41.7  Platelet Count (CBC) 189  MCV 92  MCH 30.9  MCHC 33.7  RDW 12.3  Neutrophil % 80.3  Lymphocyte % 9.1  Monocyte % 8.4  Eosinophil % 1.7  Basophil % 0.5  Neutrophil #  10.8  Lymphocyte # 1.2  Monocyte #  1.1  Eosinophil # 0.2  Basophil # 0.1 (Result(s) reported on 21 Feb 2013 at 05:43AM.)   Assessment/Plan:  Assessment/Plan:  Assessment 1. Urinary Retention s/p Fall with Left Femur Fracture -multi-factorial (severe, anatomic BPH; acute pain with left femur fx; immobility s/p left femur fx) -foley was unable to be placed at the bedside requiring flexible cystourethroscopy with endoscopic-assisted foley placement under anesthesia  2. BPH (see #2) -severe, occlusive bilobar BPH with tortuous prostate fossa and marked elevation of the prostate floor/bladder neck  3. Gross Hematuria s/p Complicated Foley Placement (see #1) -resolved off lovenox, baby ASA   Plan 1. Keep foley catheter for a minimum of one week AND until pt is fully ambulatory (please do not remove without Urology clearance).  2. Tamsulosin 0.100m po qDay after the same meal.  [facility] - the family requests f/u with Dr. HOrlie Pollengroup.   Electronic Signatures: KDarcella Cheshire(MD)  (Signed 01-Feb-15 11:43)  Authored: Chief Complaint, VITAL SIGNS/ANCILLARY NOTES, Brief Assessment, Lab Results, Assessment/Plan   Last Updated: 01-Feb-15 11:43 by KDarcella Cheshire(MD)

## 2014-05-14 NOTE — Consult Note (Signed)
Urology Consultation Report/Procedure Note: for Consultation: Urinary Retention with inability to place a foley catheter MD: Dr. Harle StanfordElgergawyMD: Marin OlpJay H. Estrella Alcaraz, M.D. 81 y.o. WM admitted s/p fall resulting in a left hip fracture.  Pt has only been able to void small amounts since admission.  Bladder Scan PVR >56000mL.  Foley unable to be placed by nursing nor Dr. Randol KernElgergawy.  Urology consulted for difficult foley placement. Pt denies prior h/o of LUTS or GU surgery. NOTE:  Attempted foley placement/Attempted filiform placement/Bedside Flexible Cystoscopy prepped and draped sterilely.  Urethra distended with 10mL of sterile lubrifax. 16 French Coude Foley unable to be placed.  Filiform passage unsuccessful.cystoscopy with sterile saline irrigation at the bedside reveals severe, occlusive bilobar prostate enlargement with inability to advance the cystoscope beyond the verumontanum due to a mild bulbar urethral stricture.  Guide wire unable to be advanced through the prostate fossa. per the detailed H&P by Dr. Imogene Burnhen dated 02/20/2013. T (98), P (63), RR (18), BP (183/83), RA Sat (92%)WD, thin WM in NAD, complaining of inability to voidWarm, Dry, no lesions about the head and neckNC/AT, EOMI, Anictericno masses/bruitsCTA, nL Respiratory EffortDistant, inaudible heart sounds, 1+ Carotid/Radial Pulses b/LPalpable bladder to the umbilicus, hypoactive BS, no organomegalynL uncirc phallus; b/L descended testes - NT, no massesno edemanon-focalA&O x4 Cr 1.22, WBC 14.8, Hct 37.7%, Plt 156k, PT 13.6/PTT 33.9, UA neg (wnL)(NAD), AP Pelvis (Bone demineralization; Right Hip DJD; Left Hip Prosthesis), Left Femur (non-displaced oblique fx left prox femur just below the lesser trochanter visible only on the frog leg view)  Acute Urinary Retentionmultifactorial (underlying previously asymptomatic BPH with mild bulbar urethral stricture, acute pain from left femur fx, immobility due to left femur fracture)to place foley at the bedside,  despite flexible cystoscopy BPH - not unexpected given advanced age; severe occlusion preventing passage of filiforms/guidewire Bulbar Urethral Stricture - possibly due to prior urethral catheterization; not tight enough to cause LUTS, however, tight enough to prevent passage of the flexible cystoscope  Cystoscopy under anesthesia with endoscopic urethral foley placement, possible SPT.   Electronic Signatures: Marin OlpKim, Jaris Kohles H (MD) (Signed on 01-Feb-15 04:06)  Authored   Last Updated: 01-Feb-15 05:13 by Marin OlpKim, Sheng Pritz H (MD)

## 2014-08-17 ENCOUNTER — Encounter: Payer: Self-pay | Admitting: *Deleted

## 2014-08-17 ENCOUNTER — Emergency Department: Payer: Medicare PPO

## 2014-08-17 ENCOUNTER — Other Ambulatory Visit: Payer: Self-pay

## 2014-08-17 ENCOUNTER — Emergency Department
Admission: EM | Admit: 2014-08-17 | Discharge: 2014-08-18 | Disposition: A | Discharge status: Home / Self Care | Payer: Medicare PPO | Attending: Emergency Medicine | Admitting: Emergency Medicine

## 2014-08-17 DIAGNOSIS — Z79899 Other long term (current) drug therapy: Secondary | ICD-10-CM | POA: Diagnosis not present

## 2014-08-17 DIAGNOSIS — Z720 Tobacco use: Secondary | ICD-10-CM | POA: Insufficient documentation

## 2014-08-17 DIAGNOSIS — I10 Essential (primary) hypertension: Secondary | ICD-10-CM | POA: Insufficient documentation

## 2014-08-17 DIAGNOSIS — E860 Dehydration: Secondary | ICD-10-CM | POA: Diagnosis not present

## 2014-08-17 DIAGNOSIS — Z7982 Long term (current) use of aspirin: Secondary | ICD-10-CM | POA: Diagnosis not present

## 2014-08-17 DIAGNOSIS — R531 Weakness: Secondary | ICD-10-CM | POA: Diagnosis present

## 2014-08-17 HISTORY — DX: Essential (primary) hypertension: I10

## 2014-08-17 LAB — BASIC METABOLIC PANEL
Anion gap: 5 (ref 5–15)
BUN: 20 mg/dL (ref 6–20)
CO2: 30 mmol/L (ref 22–32)
Calcium: 8.5 mg/dL — ABNORMAL LOW (ref 8.9–10.3)
Chloride: 101 mmol/L (ref 101–111)
Creatinine, Ser: 1.29 mg/dL — ABNORMAL HIGH (ref 0.61–1.24)
GFR calc Af Amer: 55 mL/min — ABNORMAL LOW (ref >60)
GFR calc non Af Amer: 48 mL/min — ABNORMAL LOW (ref >60)
Glucose, Bld: 112 mg/dL — ABNORMAL HIGH (ref 65–99)
Potassium: 4.7 mmol/L (ref 3.5–5.1)
Sodium: 136 mmol/L (ref 135–145)

## 2014-08-17 LAB — URINALYSIS COMPLETE WITH MICROSCOPIC (ARMC ONLY)
Bacteria, UA: NONE SEEN
Bilirubin Urine: NEGATIVE
Glucose, UA: NEGATIVE mg/dL
Ketones, ur: NEGATIVE mg/dL
Leukocytes, UA: NEGATIVE
Nitrite: NEGATIVE
PROTEIN: NEGATIVE mg/dL
Specific Gravity, Urine: 1.018 (ref 1.005–1.030)
Squamous Epithelial / LPF: NONE SEEN
pH: 5 (ref 5.0–8.0)

## 2014-08-17 LAB — CBC
HCT: 42.5 % (ref 40.0–52.0)
HEMOGLOBIN: 14 g/dL (ref 13.0–18.0)
MCH: 30.1 pg (ref 26.0–34.0)
MCHC: 33 g/dL (ref 32.0–36.0)
MCV: 91.1 fL (ref 80.0–100.0)
Platelets: 163 10*3/uL (ref 150–440)
RBC: 4.66 MIL/uL (ref 4.40–5.90)
RDW: 12.6 % (ref 11.5–14.5)
WBC: 6.2 10*3/uL (ref 3.8–10.6)

## 2014-08-17 LAB — TROPONIN I: Troponin I: <0.03 ng/mL (ref <0.031)

## 2014-08-17 MED ORDER — SODIUM CHLORIDE 0.9 % IV SOLN
1000.0000 mL | Freq: Once | INTRAVENOUS | Status: AC
  Administered 2014-08-17: 1000 mL via INTRAVENOUS

## 2014-08-17 NOTE — ED Notes (Signed)
MD at bedside. 

## 2014-08-17 NOTE — ED Provider Notes (Signed)
[facility] Emergency Department Provider Note  ____________________________________________  Time seen: 10 PM  I have reviewed the triage vital signs and the nursing notes.   HISTORY  Chief Complaint Weakness    HPI Eric [name] is a 81 y.o. male who presents with weakness. Family reports he has been most of the day in bed which is unlike him. Patient does not report any chest pain or focal deficits. He reports low energy. Family reports he is been like this before in the past when he has been dehydrated. No nausea no vomiting. No headache. No fevers no chills     Past Medical History  Diagnosis Date  . Hypertension     There are no active problems to display for this patient.   Past Surgical History  Procedure Laterality Date  . Hip surgery    . Elbow surgery      Current Outpatient Rx  Name  Route  Sig  Dispense  Refill  . aspirin EC 81 MG tablet   Oral   Take 1 tablet by mouth daily.         Marland Kitchen atorvastatin (LIPITOR) 20 MG tablet   Oral   Take 20 mg by mouth daily.      5   . finasteride (PROSCAR) 5 MG tablet   Oral   Take 5 mg by mouth daily.      3   . metoprolol succinate (TOPROL-XL) 25 MG 24 hr tablet   Oral   Take 1 tablet by mouth daily.      6   . nitroGLYCERIN (NITROSTAT) 0.4 MG SL tablet   Sublingual   Place under the tongue as needed.           Allergies Eggs or egg-derived products  No family history on file.  Social History History  Substance Use Topics  . Smoking status: Current Every Day Smoker  . Smokeless tobacco: Not on file  . Alcohol Use: No    Review of Systems  Constitutional: Negative for fever. Eyes: Negative for visual changes. ENT: Negative for sore throat Cardiovascular: Negative for chest pain. Respiratory: Negative for shortness of breath. Gastrointestinal: Negative for abdominal pain, vomiting and diarrhea. Genitourinary: Negative for dysuria. Musculoskeletal: Negative  for back pain. Skin: Negative for rash. Neurological: Negative for headaches or focal weakness     ____________________________________________   PHYSICAL EXAM:  VITAL SIGNS: ED Triage Vitals  Enc Vitals Group     BP 08/17/14 2104 153/71 mmHg     Pulse Rate 08/17/14 2104 71     Resp 08/17/14 2104 18     Temp 08/17/14 2104 99.8 F (37.7 C)     Temp Source 08/17/14 2104 Oral     SpO2 08/17/14 2104 97 %     Weight 08/17/14 2104 108 lb (48.988 kg)     Height 08/17/14 2104  (1.575 m)     Head Cir --      Peak Flow --      Pain Score --      Pain Loc --      Pain Edu? --      Excl. in GC? --      Constitutional: Alert and oriented. Well appearing and in no distress. Eyes: Conjunctivae are normal.  ENT   Head: Normocephalic and atraumatic.   Mouth/Throat: Mucous membranes are dry Cardiovascular: Normal rate, regular rhythm. Normal and symmetric distal pulses are present in all extremities. No murmurs, rubs, or gallops. Respiratory:  Normal respiratory effort without tachypnea nor retractions. Breath sounds are clear and equal bilaterally.  Gastrointestinal: Soft and non-tender in all quadrants. No distention. There is no CVA tenderness. Genitourinary: deferred Musculoskeletal: Nontender with normal range of motion in all extremities. No lower extremity tenderness nor edema. Neurologic:  Normal speech and language. No gross focal neurologic deficits are appreciated. Skin:  Skin is warm, dry and intact. No rash noted. Psychiatric: Mood and affect are normal. Patient exhibits appropriate insight and judgment.  ____________________________________________    LABS (pertinent positives/negatives)  Labs Reviewed  BASIC METABOLIC PANEL - Abnormal; Notable for the following:    Glucose, Bld 112 (*)    Creatinine, Ser 1.29 (*)    Calcium 8.5 (*)    GFR calc non Af Amer 48 (*)    GFR calc Af Amer 55 (*)    All other components within normal limits  URINALYSIS  COMPLETEWITH MICROSCOPIC ([facility] ONLY) - Abnormal; Notable for the following:    Color, Urine YELLOW (*)    APPearance CLEAR (*)    Hgb urine dipstick 1+ (*)    All other components within normal limits  CBC  TROPONIN I    ____________________________________________   EKG  ED ECG REPORT I, Jene Every, the attending physician, personally viewed and interpreted this ECG.   Date: 08/17/2014  EKG Time: 9:20 PM  Rate: 72  Rhythm: 1st degree AV block  Axis: Normal axis  Intervals: First degree AV block  ST&T Change: None   ____________________________________________    RADIOLOGY I have personally reviewed any xrays that were ordered on this patient: Chest x-ray unremarkable  ____________________________________________   PROCEDURES  Procedure(s) performed: none  Critical Care performed:none  ____________________________________________   INITIAL IMPRESSION / ASSESSMENT AND PLAN / ED COURSE  Pertinent labs & imaging results that were available during my care of the patient were reviewed by me and considered in my medical decision making (see chart for details).  Patient with relatively benign exam. Lab suspicious for dehydration. We'll give 1 L normal saline and reevaluate.  ----------------------------------------- 11:51 PM on 08/17/2014 -----------------------------------------  Patient feeling significantly better. I suspect this is related to dehydration. Recommend increased by mouth intake and PCP follow-up. Return to ED if any issues ____________________________________________   FINAL CLINICAL IMPRESSION(S) / ED DIAGNOSES  Final diagnoses:  Dehydration     Jene Every, MD 08/17/14 2351

## 2014-08-17 NOTE — Discharge Instructions (Signed)
Dehydration Dehydration is when you lose more fluids from the body than you take in. Vital organs such as the kidneys, brain, and heart cannot function without a proper amount of fluids and salt. Any loss of fluids from the body can cause dehydration.  Older adults are at a higher risk of dehydration than younger adults. As we age, our bodies are less able to conserve water and do not respond to temperature changes as well. Also, older adults do not become thirsty as easily or quickly. Because of this, older adults often do not realize they need to increase fluids to avoid dehydration.  CAUSES   Vomiting.  Diarrhea.  Excessive sweating.  Excessive urination.  Fever.  Certain medicines, such as blood pressure medicines called diuretics.  Poorly controlled blood sugars. SIGNS AND SYMPTOMS  Mild dehydration:  Thirst.  Dry lips.  Slightly dry mouth. Moderate dehydration:  Very dry mouth.  Sunken eyes.  Skin does not bounce back quickly when lightly pinched and released.  Dark urine and decreased urine production.  Decreased tear production.  Headache. Severe dehydration:  Very dry mouth.  Extreme thirst.  Rapid, weak pulse (more than 100 beats per minute at rest).  Cold hands and feet.  Not able to sweat in spite of heat.  Rapid breathing.  Blue lips.  Confusion and lethargy.  Difficulty being awakened.  Minimal urine production.  No tears. DIAGNOSIS  Your health care provider will diagnose dehydration based on your symptoms and your exam. Blood and urine tests will help confirm the diagnosis. The diagnostic evaluation should also identify the cause of dehydration. TREATMENT  Treatment of mild or moderate dehydration can often be done at home by increasing the amount of fluids that you drink. It is best to drink small amounts of fluid more often. Drinking too much at one time can make vomiting worse. Severe dehydration needs to be treated at the hospital.  You may be given IV fluids that contain water and electrolytes. HOME CARE INSTRUCTIONS   Ask your health care provider about specific rehydration instructions.  Drink enough fluids to keep your urine clear or pale yellow.  Drink small amounts frequently if you have nausea and vomiting.  Eat as you normally do.  Avoid:  Foods or drinks high in sugar.  Carbonated drinks.  Juice.  Extremely hot or cold fluids.  Drinks with caffeine.  Fatty, greasy foods.  Alcohol.  Tobacco.  Overeating.  Gelatin desserts.  Wash your hands well to avoid spreading bacteria and viruses.  Only take over-the-counter or prescription medicines for pain, discomfort, or fever as directed by your health care provider.  Ask your health care provider if you should continue all prescribed and over-the-counter medicines.  Keep all follow-up appointments with your health care provider. SEEK MEDICAL CARE IF:  You have abdominal pain, and it increases or stays in one area (localizes).  You have a rash, stiff neck, or severe headache.  You are irritable, sleepy, or difficult to awaken.  You are weak, dizzy, or extremely thirsty.  You have a fever. SEEK IMMEDIATE MEDICAL CARE IF:   You are unable to keep fluids down, or you get worse despite treatment.  You have frequent episodes of vomiting or diarrhea.  You have blood or green matter (bile) in your vomit.  You have blood in your stool, or your stool looks black and tarry.  You have not urinated in 6-8 hours, or you have only urinated a small amount of very dark urine.    You faint. MAKE SURE YOU:   Understand these instructions.  Will watch your condition.  Will get help right away if you are not doing well or get worse. Document Released: 03/30/2003 Document Revised: 01/12/2013 Document Reviewed: 09/14/2012 Exit[employer] Patient Information 2015 [employer]. This information is not intended to replace advice given to you by your  health care provider. Make sure you discuss any questions you have with your health care provider.  

## 2014-08-17 NOTE — ED Notes (Signed)
Blood culture x1 sent with triage labs

## 2014-08-17 NOTE — ED Notes (Addendum)
Pt to triage via wheelchair.  Family states pt with weakness and confusion today. Family reports pt sleeping a lot today.  Pt alert and answering questions approp in triage.  Pt denies any pain. No headache.  Speech clear.

## 2014-08-17 NOTE — ED Notes (Signed)
Lab notified of add on troponin level 

## 2014-12-30 ENCOUNTER — Encounter: Payer: Self-pay | Admitting: *Deleted

## 2015-01-05 ENCOUNTER — Encounter: Payer: Self-pay | Admitting: Urology

## 2015-01-05 ENCOUNTER — Ambulatory Visit (INDEPENDENT_AMBULATORY_CARE_PROVIDER_SITE_OTHER): Payer: Medicare PPO | Admitting: Urology

## 2015-01-05 VITALS — BP 155/91 | HR 63 | Ht 62.0 in | Wt 118.9 lb

## 2015-01-05 DIAGNOSIS — Z87448 Personal history of other diseases of urinary system: Secondary | ICD-10-CM | POA: Diagnosis not present

## 2015-01-05 DIAGNOSIS — N138 Other obstructive and reflux uropathy: Secondary | ICD-10-CM

## 2015-01-05 DIAGNOSIS — N401 Enlarged prostate with lower urinary tract symptoms: Secondary | ICD-10-CM

## 2015-01-05 DIAGNOSIS — Z87898 Personal history of other specified conditions: Secondary | ICD-10-CM

## 2015-01-05 MED ORDER — FINASTERIDE 5 MG PO TABS: 5.0000 mg | ORAL_TABLET | Freq: Every day | ORAL | Status: DC

## 2015-01-05 NOTE — Progress Notes (Signed)
01/05/2015 10:26 AM   Eric [name] Oct 01, 1923 [id number]  Referring provider: Marguarite Arbour, MD [address] [facility] [place], [place] [address]  Chief Complaint  Patient presents with  . Benign Prostatic Hypertrophy    1 year recheck    HPI: Patient is an 81 year old Caucasian male with a history of urinary retention an BPH with LUTS who presents for a one year follow up.   History of urinary retention Patient suffered a postoperative urinary retention after he suffered a left femur fracture. He was initiated on finasteride. He has been well controlled on that medication.  BPH WITH LUTS His IPSS score today is 1, which is mild lower urinary tract symptomatology. He is mostly satisfied with his quality life due to his urinary symptoms. He denies any dysuria, hematuria or suprapubic pain.  He currently taking finasteride.  His has had a cystoscopy which identified middle lobe hypertrophy in 03/2013.  He also denies any recent fevers, chills, nausea or vomiting.  He does not have a family history of PCa.      IPSS      01/05/15 1500       International Prostate Symptom Score   How often have you had the sensation of not emptying your bladder? Not at All     How often have you had to urinate less than every two hours? Not at All     How often have you found you stopped and started again several times when you urinated? Not at All     How often have you found it difficult to postpone urination? Not at All     How often have you had a weak urinary stream? Not at All     How often have you had to strain to start urination? Not at All     How many times did you typically get up at night to urinate? 1 Time     Total IPSS Score 1     Quality of Life due to urinary symptoms   If you were to spend the rest of your life with your urinary condition just the way it is now how would you feel about that? Mostly Satisfied        Score:  1-7 Mild 8-19  Moderate 20-35 Severe     PMH: Past Medical History  Diagnosis Date  . Hypertension   . Urinary retention   . CAD (coronary artery disease)   . HLD (hyperlipidemia)   . COPD (chronic obstructive pulmonary disease) (HCC)   . GERD (gastroesophageal reflux disease)   . Hematuria   . Osteoporosis   . BPH (benign prostatic hyperplasia)   . Elevated PSA   . Fracture of left femur [facility])     Surgical History: Past Surgical History  Procedure Laterality Date  . Hip surgery Left   . Elbow surgery Right   . Appendectomy    . Carotid surgery stent    . Arm surgery Right     Home Medications:    Medication List       This list is accurate as of: 01/05/15 11:59 PM.  Always use your most recent med list.               aspirin EC 81 MG tablet  Take by mouth.     atorvastatin 20 MG tablet  Commonly known as:  LIPITOR  Take 20 mg by mouth daily.     finasteride 5  MG tablet  Commonly known as:  PROSCAR  Take 1 tablet (5 mg total) by mouth daily.     metoprolol succinate 25 MG 24 hr tablet  Commonly known as:  TOPROL-XL  Take 1 tablet by mouth daily.     nitroGLYCERIN 0.4 MG SL tablet  Commonly known as:  NITROSTAT  Place under the tongue as needed.        Allergies:  Allergies  Allergen Reactions  . Eggs Or Egg-Derived Products Nausea And Vomiting    Family History: Family History  Problem Relation Age of Onset  . Stroke Mother   . Heart attack Father   . Prostate cancer Neg Hx   . Bladder Cancer Neg Hx     Social History:  reports that he has been smoking Pipe.  He does not have any smokeless tobacco history on file. He reports that he does not drink alcohol or use illicit drugs.  ROS: UROLOGY Frequent Urination?: No Hard to postpone urination?: No Burning/pain with urination?: No Get up at night to urinate?: Yes Leakage of urine?: No Urine stream starts and stops?: No Trouble starting stream?: No Do you have to strain to urinate?: No Blood in  urine?: No Urinary tract infection?: No Sexually transmitted disease?: No Injury to kidneys or bladder?: No Painful intercourse?: No Weak stream?: No Erection problems?: No Penile pain?: No  Gastrointestinal Nausea?: No Vomiting?: No Indigestion/heartburn?: No Diarrhea?: No Constipation?: No  Constitutional Fever: No Night sweats?: No Weight loss?: No Fatigue?: No  Skin Skin rash/lesions?: No Itching?: No  Eyes Blurred vision?: No Double vision?: No  Ears/Nose/Throat Sore throat?: No Sinus problems?: No  Hematologic/Lymphatic Swollen glands?: No Easy bruising?: No  Cardiovascular Leg swelling?: No Chest pain?: No  Respiratory Cough?: No Shortness of breath?: No  Endocrine Excessive thirst?: No  Musculoskeletal Back pain?: No Joint pain?: No  Neurological Headaches?: No Dizziness?: No  Psychologic Depression?: No Anxiety?: No  Physical Exam: BP 155/91 mmHg  Pulse 63  Ht 5\' 2"  (1.575 m)  Wt 118 lb 14.4 oz (53.933 kg)  BMI 21.74 kg/m2  GU: No CVA tenderness.  No bladder fullness or masses.  Patient with uncircumcised phallus. Foreskin easily retracted  Urethral meatus is patent.  No penile discharge. No penile lesions or rashes. Scrotum without lesions, cysts, rashes and/or edema.  Testicles are located scrotally bilaterally. No masses are appreciated in the testicles. Left and right epididymis are normal. Rectal: Patient with  normal sphincter tone. Anus and perineum without scarring or rashes. No rectal masses are appreciated. Prostate is approximately 60 grams, no nodules are appreciated. Seminal vesicles are normal.   Laboratory Data: Lab Results  Component Value Date   WBC 6.2 08/17/2014   HGB 14.0 08/17/2014   HCT 42.5 08/17/2014   MCV 91.1 08/17/2014   PLT 163 08/17/2014    Lab Results  Component Value Date   CREATININE 1.29* 08/17/2014      Assessment & Plan:    1. History of urinary retention:   Patient suffered a  postoperative urinary retention after he suffered a left femur fracture. He was initiated on finasteride. He has been well controlled on that medication.  2. BPH with LUTS:    IPSS score is 1/2.  A refill for finasteride is sent to his pharmacy.  He will RTC in one year for IPSS score and exam.     Return in about 1 year (around 01/05/2016) for IPSS score and exam.  Michiel CowboySHANNON Ailyne Pawley, [facility]  [facility] [address], [place], [place] [address] [phone]

## 2015-01-10 DIAGNOSIS — N138 Other obstructive and reflux uropathy: Secondary | ICD-10-CM | POA: Insufficient documentation

## 2015-01-10 DIAGNOSIS — Z87898 Personal history of other specified conditions: Secondary | ICD-10-CM | POA: Insufficient documentation

## 2015-01-10 DIAGNOSIS — N401 Enlarged prostate with lower urinary tract symptoms: Secondary | ICD-10-CM

## 2016-01-11 ENCOUNTER — Ambulatory Visit (INDEPENDENT_AMBULATORY_CARE_PROVIDER_SITE_OTHER): Payer: Medicare Other | Admitting: Urology

## 2016-01-11 ENCOUNTER — Encounter: Payer: Self-pay | Admitting: Urology

## 2016-01-11 VITALS — BP 147/77 | HR 66 | Ht 62.0 in | Wt 121.0 lb

## 2016-01-11 DIAGNOSIS — N401 Enlarged prostate with lower urinary tract symptoms: Secondary | ICD-10-CM

## 2016-01-11 DIAGNOSIS — Z87898 Personal history of other specified conditions: Secondary | ICD-10-CM

## 2016-01-11 DIAGNOSIS — N138 Other obstructive and reflux uropathy: Secondary | ICD-10-CM

## 2016-01-11 MED ORDER — FINASTERIDE 5 MG PO TABS: 5.0000 mg | ORAL_TABLET | Freq: Every day | ORAL | 3 refills | Status: DC

## 2016-01-11 NOTE — Progress Notes (Signed)
01/11/2016 4:11 PM   Eric [name] [birth year] [id number]  Referring provider: Marguarite ArbourJeffrey D Sparks, MD [address] [facility] [place], [place] [address]  Chief Complaint  Patient presents with  . Urinary Retention    HPI: Patient is an 82 year old Caucasian male with a history of urinary retention an BPH with LUTS who presents for a one year follow up.   History of urinary retention Patient suffered a postoperative urinary retention after he suffered a left femur fracture. He was initiated on finasteride. He has been well controlled on that medication.  BPH WITH LUTS His IPSS score today is 2, which is mild lower urinary tract symptomatology. He is pleased with his quality life due to his urinary symptoms. His previous I PSS score was 1/2.  His complaints at this time are nocturia x 1.  He denies any dysuria, hematuria or suprapubic pain.  He currently taking finasteride.  His has had a cystoscopy which identified middle lobe hypertrophy in 03/2013.  He also denies any recent fevers, chills, nausea or vomiting.  He does not have a family history of PCa.      IPSS    Row Name 01/11/16 1500         International Prostate Symptom Score   How often have you had the sensation of not emptying your bladder? Not at All     How often have you had to urinate less than every two hours? Not at All     How often have you found you stopped and started again several times when you urinated? Not at All     How often have you found it difficult to postpone urination? Less than 1 in 5 times     How often have you had a weak urinary stream? Not at All     How often have you had to strain to start urination? Not at All     How many times did you typically get up at night to urinate? 1 Time     Total IPSS Score 2       Quality of Life due to urinary symptoms   If you were to spend the rest of your life with your urinary condition just the way it is now how would you feel about that?  Pleased        Score:  1-7 Mild 8-19 Moderate 20-35 Severe     PMH: Past Medical History:  Diagnosis Date  . BPH (benign prostatic hyperplasia)   . CAD (coronary artery disease)   . COPD (chronic obstructive pulmonary disease) (HCC)   . Elevated PSA   . Fracture of left femur (HCC)   . GERD (gastroesophageal reflux disease)   . Hematuria   . HLD (hyperlipidemia)   . Hypertension   . Osteoporosis   . Urinary retention     Surgical History: Past Surgical History:  Procedure Laterality Date  . APPENDECTOMY    . Arm surgery Right   . carotid surgery stent    . ELBOW SURGERY Right   . HIP SURGERY Left     Home Medications:  Allergies as of 01/11/2016      Reactions   Eggs Or Egg-derived Products Nausea And Vomiting      Medication List       Accurate as of 01/11/16  4:11 PM. Always use your most recent med list.          aspirin EC 81 MG tablet Take  by mouth.   atorvastatin 20 MG tablet Commonly known as:  LIPITOR Take 20 mg by mouth daily.   finasteride 5 MG tablet Commonly known as:  PROSCAR Take 1 tablet (5 mg total) by mouth daily.   metoprolol succinate 25 MG 24 hr tablet Commonly known as:  TOPROL-XL Take 1 tablet by mouth daily.   nitroGLYCERIN 0.4 MG SL tablet Commonly known as:  NITROSTAT Place under the tongue as needed.       Allergies:  Allergies  Allergen Reactions  . Eggs Or Egg-Derived Products Nausea And Vomiting    Family History: Family History  Problem Relation Age of Onset  . Stroke Mother   . Heart attack Father   . Prostate cancer Neg Hx   . Bladder Cancer Neg Hx     Social History:  reports that he has been smoking Pipe.  He has never used smokeless tobacco. He reports that he does not drink alcohol or use drugs.  ROS: UROLOGY Frequent Urination?: No Hard to postpone urination?: No Burning/pain with urination?: No Get up at night to urinate?: Yes Leakage of urine?: No Urine stream starts and stops?:  No Trouble starting stream?: No Do you have to strain to urinate?: No Blood in urine?: No Urinary tract infection?: No Sexually transmitted disease?: No Injury to kidneys or bladder?: No Painful intercourse?: No Weak stream?: No Erection problems?: No Penile pain?: No  Gastrointestinal Nausea?: No Vomiting?: No Indigestion/heartburn?: No Diarrhea?: No Constipation?: No  Constitutional Fever: No Night sweats?: No Weight loss?: No Fatigue?: No  Skin Skin rash/lesions?: No Itching?: No  Eyes Blurred vision?: No Double vision?: No  Ears/Nose/Throat Sore throat?: No Sinus problems?: No  Hematologic/Lymphatic Swollen glands?: No Easy bruising?: Yes  Cardiovascular Leg swelling?: No Chest pain?: No  Respiratory Cough?: No Shortness of breath?: No  Endocrine Excessive thirst?: No  Musculoskeletal Back pain?: No Joint pain?: No  Neurological Headaches?: No Dizziness?: No  Psychologic Depression?: No Anxiety?: No  Physical Exam: BP (!) 147/77 (BP Location: Left Arm, Patient Position: Sitting, Cuff Size: Normal)   Pulse 66   Ht 5\' 2"  (1.575 m)   Wt 121 lb (54.9 kg)   BMI 22.13 kg/m   Constitutional: Well nourished. Alert and oriented, No acute distress. HEENT: [place] AT, moist mucus membranes. Trachea midline, no masses. Cardiovascular: No clubbing, cyanosis, or edema. Respiratory: Normal respiratory effort, no increased work of breathing. GI: Abdomen is soft, non tender, non distended, no abdominal masses. Liver and spleen not palpable.  No hernias appreciated.  Stool sample for occult testing is not indicated.   GU: No CVA tenderness.  No bladder fullness or masses.  Genital exam deferred.  Rectal: Deferred Skin: No rashes, bruises or suspicious lesions. Lymph: No cervical or inguinal adenopathy. Neurologic: Grossly intact, no focal deficits, moving all 4 extremities. Psychiatric: Normal mood and affect.   Laboratory Data: Lab Results   Component Value Date   WBC 6.2 08/17/2014   HGB 14.0 08/17/2014   HCT 42.5 08/17/2014   MCV 91.1 08/17/2014   PLT 163 08/17/2014    Lab Results  Component Value Date   CREATININE 1.29 (H) 08/17/2014    Assessment & Plan:    1. History of urinary retention:   Patient suffered a postoperative urinary retention after he suffered a left femur fracture. He was initiated on finasteride. He has been well controlled on that medication.  2. BPH with LUTS:    IPSS score is 2/1.  A refill for finasteride is  sent to his pharmacy.  He will RTC in one year for IPSS score and exam.     Return in about 1 year (around 01/10/2017) for IPSS, PVR and exam.  Michiel CowboySHANNON Nithila Sumners, [facility]  [facility] [address], [address] [place], [place] [address] [phone]

## 2016-06-24 ENCOUNTER — Encounter: Payer: Self-pay | Admitting: *Deleted

## 2016-06-24 ENCOUNTER — Emergency Department: Payer: Medicare Other

## 2016-06-24 ENCOUNTER — Emergency Department
Admission: EM | Admit: 2016-06-24 | Discharge: 2016-06-24 | Disposition: A | Discharge status: Home / Self Care | Payer: Medicare Other | Attending: Emergency Medicine | Admitting: Emergency Medicine

## 2016-06-24 DIAGNOSIS — R251 Tremor, unspecified: Secondary | ICD-10-CM | POA: Diagnosis not present

## 2016-06-24 DIAGNOSIS — J449 Chronic obstructive pulmonary disease, unspecified: Secondary | ICD-10-CM | POA: Insufficient documentation

## 2016-06-24 DIAGNOSIS — F17200 Nicotine dependence, unspecified, uncomplicated: Secondary | ICD-10-CM | POA: Insufficient documentation

## 2016-06-24 DIAGNOSIS — I10 Essential (primary) hypertension: Secondary | ICD-10-CM | POA: Insufficient documentation

## 2016-06-24 DIAGNOSIS — I2510 Atherosclerotic heart disease of native coronary artery without angina pectoris: Secondary | ICD-10-CM | POA: Insufficient documentation

## 2016-06-24 DIAGNOSIS — Z79899 Other long term (current) drug therapy: Secondary | ICD-10-CM | POA: Insufficient documentation

## 2016-06-24 DIAGNOSIS — Z7982 Long term (current) use of aspirin: Secondary | ICD-10-CM | POA: Diagnosis not present

## 2016-06-24 LAB — CBC
HCT: 42.2 % (ref 40.0–52.0)
HEMOGLOBIN: 14.3 g/dL (ref 13.0–18.0)
MCH: 30.3 pg (ref 26.0–34.0)
MCHC: 34 g/dL (ref 32.0–36.0)
MCV: 89.1 fL (ref 80.0–100.0)
PLATELETS: 206 10*3/uL (ref 150–440)
RBC: 4.73 MIL/uL (ref 4.40–5.90)
RDW: 12.8 % (ref 11.5–14.5)
WBC: 10.3 10*3/uL (ref 3.8–10.6)

## 2016-06-24 LAB — COMPREHENSIVE METABOLIC PANEL
ALBUMIN: 3.6 g/dL (ref 3.5–5.0)
ALT: 12 U/L — ABNORMAL LOW (ref 17–63)
AST: 26 U/L (ref 15–41)
Alkaline Phosphatase: 119 U/L (ref 38–126)
Anion gap: 8 (ref 5–15)
BILIRUBIN TOTAL: 1 mg/dL (ref 0.3–1.2)
BUN: 12 mg/dL (ref 6–20)
CALCIUM: 8.6 mg/dL — AB (ref 8.9–10.3)
CO2: 27 mmol/L (ref 22–32)
Chloride: 99 mmol/L — ABNORMAL LOW (ref 101–111)
Creatinine, Ser: 1.21 mg/dL (ref 0.61–1.24)
GFR calc non Af Amer: 51 mL/min — ABNORMAL LOW (ref >60)
GFR, EST AFRICAN AMERICAN: 59 mL/min — AB (ref >60)
GLUCOSE: 110 mg/dL — AB (ref 65–99)
Potassium: 4.4 mmol/L (ref 3.5–5.1)
Sodium: 134 mmol/L — ABNORMAL LOW (ref 135–145)
TOTAL PROTEIN: 7.2 g/dL (ref 6.5–8.1)

## 2016-06-24 LAB — URINALYSIS, COMPLETE (UACMP) WITH MICROSCOPIC
Bacteria, UA: NONE SEEN
Bilirubin Urine: NEGATIVE
GLUCOSE, UA: NEGATIVE mg/dL
HGB URINE DIPSTICK: NEGATIVE
Ketones, ur: NEGATIVE mg/dL
Leukocytes, UA: NEGATIVE
Nitrite: NEGATIVE
PH: 6 (ref 5.0–8.0)
Protein, ur: NEGATIVE mg/dL
Specific Gravity, Urine: 1.011 (ref 1.005–1.030)
Squamous Epithelial / LPF: NONE SEEN

## 2016-06-24 LAB — TROPONIN I: Troponin I: <0.03 ng/mL (ref <0.03)

## 2016-06-24 NOTE — Discharge Instructions (Signed)
Please seek medical attention for any high fevers, chest pain, shortness of breath, change in behavior, persistent vomiting, bloody stool or any other new or concerning symptoms.  

## 2016-06-24 NOTE — ED Provider Notes (Signed)
[facility] Emergency Department Provider Note   ____________________________________________   I have reviewed the triage vital signs and the nursing notes.   HISTORY  Chief Complaint Shaking  History limited by: Not Limited, some history obtained from daughter   HPI Eric [name] is a 83 y.o. male who presents to the emergency department today as of concerns for being cold and shaking. Daughter states when she went to visit him this out and he was shaking. He stated that he was feeling cold. The daughter tried to warm him up but he continued to shake. The patient did not eat or drink anything today so the daughter also has some concerns for dehydration. The patient denies any pain. No fevers.   Past Medical History:  Diagnosis Date  . BPH (benign prostatic hyperplasia)   . CAD (coronary artery disease)   . COPD (chronic obstructive pulmonary disease) (HCC)   . Elevated PSA   . Fracture of left femur (HCC)   . GERD (gastroesophageal reflux disease)   . Hematuria   . HLD (hyperlipidemia)   . Hypertension   . Osteoporosis   . Urinary retention     Patient Active Problem List   Diagnosis Date Noted  . History of urinary retention 01/10/2015  . BPH with obstruction/lower urinary tract symptoms 01/10/2015  . Coronary artery disease 01/07/2011  . Dyslipidemia 01/07/2011  . S/P hip replacement 01/07/2011    Past Surgical History:  Procedure Laterality Date  . APPENDECTOMY    . Arm surgery Right   . carotid surgery stent    . ELBOW SURGERY Right   . HIP SURGERY Left     Prior to Admission medications   Medication Sig Start Date End Date Taking? Authorizing Provider  aspirin EC 81 MG tablet Take 81 mg by mouth daily.     [provider]  atorvastatin (LIPITOR) 20 MG tablet Take 20 mg by mouth daily. 08/08/14   [provider]  finasteride (PROSCAR) 5 MG tablet Take 1 tablet (5 mg total) by mouth daily. 01/11/16   Michiel CowboyMcGowan,  Shannon A, PA-C  metoprolol succinate (TOPROL-XL) 25 MG 24 hr tablet Take 1 tablet by mouth daily. 07/22/14   [provider]  nitroGLYCERIN (NITROSTAT) 0.4 MG SL tablet Place under the tongue as needed.    [provider]    Allergies Eggs or egg-derived products  Family History  Problem Relation Age of Onset  . Stroke Mother   . Heart attack Father   . Prostate cancer Neg Hx   . Bladder Cancer Neg Hx     Social History Social History  Substance Use Topics  . Smoking status: Current Every Day Smoker    Types: Pipe  . Smokeless tobacco: Never Used  . Alcohol use No    Review of Systems Constitutional: Positive for shaking. Eyes: No visual changes. ENT: No sore throat. Cardiovascular: Denies chest pain. Respiratory: Denies shortness of breath. Gastrointestinal: No abdominal pain.  No nausea, no vomiting.  No diarrhea.   Genitourinary: Negative for dysuria. Musculoskeletal: Negative for back pain. Skin: Negative for rash. Neurological: Negative for headaches, focal weakness or numbness.  ____________________________________________   PHYSICAL EXAM:  VITAL SIGNS: ED Triage Vitals  Enc Vitals Group     BP 06/24/16 1946 130/79     Pulse Rate 06/24/16 1946 98     Resp 06/24/16 1946 18     Temp 06/24/16 1946 98.2 F (36.8 C)     Temp Source 06/24/16 1946  Oral     SpO2 06/24/16 1946 99 %     Weight 06/24/16 1944 110 lb (49.9 kg)     Height 06/24/16 1944 5\' 1"  (1.549 m)   Constitutional: Alert and oriented. Well appearing and in no distress. Eyes: Conjunctivae are normal.  ENT   Head: Normocephalic and atraumatic.   Nose: No congestion/rhinnorhea.   Mouth/Throat: Mucous membranes are moist.   Neck: No stridor. Hematological/Lymphatic/Immunilogical: No cervical lymphadenopathy. Cardiovascular: Normal rate, regular rhythm.  No murmurs, rubs, or gallops. Respiratory: Normal respiratory effort without tachypnea nor retractions. Breath  sounds are clear and equal bilaterally. No wheezes/rales/rhonchi. Gastrointestinal: Soft and non tender. No rebound. No guarding.  Genitourinary: Deferred Musculoskeletal: Normal range of motion in all extremities. No lower extremity edema. Neurologic:  Normal speech and language. No gross focal neurologic deficits are appreciated.  Skin:  Skin is warm, dry and intact. No rash noted. Psychiatric: Mood and affect are normal. Speech and behavior are normal. Patient exhibits appropriate insight and judgment.  ____________________________________________    LABS (pertinent positives/negatives)  Labs Reviewed  COMPREHENSIVE METABOLIC PANEL - Abnormal; Notable for the following:       Result Value   Sodium 134 (*)    Chloride 99 (*)    Glucose, Bld 110 (*)    Calcium 8.6 (*)    ALT 12 (*)    GFR calc non Af Amer 51 (*)    GFR calc Af Amer 59 (*)    All other components within normal limits  URINALYSIS, COMPLETE (UACMP) WITH MICROSCOPIC - Abnormal; Notable for the following:    Color, Urine YELLOW (*)    APPearance CLEAR (*)    All other components within normal limits  CBC  TROPONIN I     ____________________________________________   EKG  I, Phineas Semen, attending physician, personally viewed and interpreted this EKG  EKG Time: 1952 Rate: 96 Rhythm: sinus rhythm with 1st degree av block Axis: normal axis Intervals: qtc 429 QRS: incomplete RBBB ST changes: no st elevation Impression: abnormal ekg   ____________________________________________    RADIOLOGY  CXR IMPRESSION:  No acute abnormality. Mild changes of COPD and chronic bronchitis.    ____________________________________________   PROCEDURES  Procedures  ____________________________________________   INITIAL IMPRESSION / ASSESSMENT AND PLAN / ED COURSE  Pertinent labs & imaging results that were available during my care of the patient were reviewed by me and considered in my medical  decision making (see chart for details).  Patient presented to the emergency department today because of concerns for some shaking. Patient's blood work urine here without any significant findings. Patient is afebrile not tachycardic. At this point I didn't see any signs of infection. No elevated creatinine or ketones in the urine. Do not think he is significantly dehydrated. They think he is safe for discharge and will plan on having patient follow up with primary care. Did discuss return precautions with patient and family.  ____________________________________________   FINAL CLINICAL IMPRESSION(S) / ED DIAGNOSES  Final diagnoses:  Shaking     Note: This dictation was prepared with Dragon dictation. Any transcriptional errors that result from this process are unintentional     Phineas Semen, MD 06/24/16 2312

## 2016-06-24 NOTE — ED Triage Notes (Signed)
Pt hasnt been drinking PO fluids, pt has no air conditioning, pt complains of being cold and having chills, pt afebrile in triage, pt denies chest pain or any other symptoms

## 2016-06-24 NOTE — ED Notes (Signed)
Ambulated pt to toilet with assistance to collect urine.

## 2016-06-24 NOTE — ED Notes (Signed)
Pt taking PO fluids w/o complaint or difficulty.

## 2017-01-08 NOTE — Progress Notes (Signed)
01/09/2017 4:10 PM   Eric [name] [birth year] [id number]  Referring provider: Marguarite ArbourSparks, Jeffrey D, MD [address] [facility] [place], [place] [address]  Chief Complaint  Patient presents with  . Urinary Retention    HPI: Patient is an 83 year old Caucasian male with a history of urinary retention an BPH with LUTS who presents for a one year follow up with daughter, Raynelle FanningJulie.    History of urinary retention Patient suffered a postoperative urinary retention after he suffered a left femur fracture. He was initiated on finasteride. He has been well controlled on that medication.  BPH WITH LUTS His IPSS score today is 7, which is mild lower urinary tract symptomatology. He is pleased with his quality life due to his urinary symptoms. His previous I PSS score was 2/1.  His complaints at this time are nocturia x 1.  He denies any dysuria, hematuria or suprapubic pain.  He currently taking finasteride.  His has had a cystoscopy which identified middle lobe hypertrophy in 03/2013.  He also denies any recent fevers, chills, nausea or vomiting.  He does not have a family history of PCa.  IPSS    Row Name 01/09/17 1500         International Prostate Symptom Score   How often have you had the sensation of not emptying your bladder?  Not at All     How often have you had to urinate less than every two hours?  Not at All     How often have you found you stopped and started again several times when you urinated?  Not at All     How often have you found it difficult to postpone urination?  Not at All     How often have you had a weak urinary stream?  About half the time     How often have you had to strain to start urination?  Not at All     How many times did you typically get up at night to urinate?  4 Times     Total IPSS Score  7       Quality of Life due to urinary symptoms   If you were to spend the rest of your life with your urinary condition just the way it is now how  would you feel about that?  Pleased        Score:  1-7 Mild 8-19 Moderate 20-35 Severe     PMH: Past Medical History:  Diagnosis Date  . BPH (benign prostatic hyperplasia)   . CAD (coronary artery disease)   . COPD (chronic obstructive pulmonary disease) (HCC)   . Elevated PSA   . Fracture of left femur (HCC)   . GERD (gastroesophageal reflux disease)   . Hematuria   . HLD (hyperlipidemia)   . Hypertension   . Osteoporosis   . Urinary retention     Surgical History: Past Surgical History:  Procedure Laterality Date  . APPENDECTOMY    . Arm surgery Right   . carotid surgery stent    . ELBOW SURGERY Right   . HIP SURGERY Left     Home Medications:  Allergies as of 01/09/2017      Reactions   Eggs Or Egg-derived Products Nausea And Vomiting      Medication List        Accurate as of 01/09/17  4:10 PM. Always use your most recent med list.  aspirin EC 81 MG tablet Take 81 mg by mouth daily.   atorvastatin 20 MG tablet Commonly known as:  LIPITOR Take 20 mg by mouth daily.   finasteride 5 MG tablet Commonly known as:  PROSCAR Take 1 tablet (5 mg total) by mouth daily.   metoprolol succinate 25 MG 24 hr tablet Commonly known as:  TOPROL-XL Take 1 tablet by mouth daily.   nitroGLYCERIN 0.4 MG SL tablet Commonly known as:  NITROSTAT Place 0.4 mg under the tongue every 5 (five) minutes as needed for chest pain.       Allergies:  Allergies  Allergen Reactions  . Eggs Or Egg-Derived Products Nausea And Vomiting    Family History: Family History  Problem Relation Age of Onset  . Stroke Mother   . Heart attack Father   . Prostate cancer Neg Hx   . Bladder Cancer Neg Hx     Social History:  reports that he has been smoking pipe.  he has never used smokeless tobacco. He reports that he does not drink alcohol or use drugs.  ROS: UROLOGY Frequent Urination?: No Hard to postpone urination?: No Burning/pain with urination?: No Get  up at night to urinate?: No Leakage of urine?: No Urine stream starts and stops?: No Trouble starting stream?: No Do you have to strain to urinate?: No Blood in urine?: No Urinary tract infection?: No Sexually transmitted disease?: No Injury to kidneys or bladder?: No Painful intercourse?: No Weak stream?: No Erection problems?: No Penile pain?: No  Gastrointestinal Nausea?: No Vomiting?: No Indigestion/heartburn?: No Diarrhea?: No Constipation?: No  Constitutional Fever: No Night sweats?: No Weight loss?: No Fatigue?: No  Skin Skin rash/lesions?: No Itching?: No  Eyes Blurred vision?: No Double vision?: No  Ears/Nose/Throat Sore throat?: No Sinus problems?: No  Hematologic/Lymphatic Swollen glands?: No Easy bruising?: No  Cardiovascular Leg swelling?: No Chest pain?: No  Respiratory Cough?: No Shortness of breath?: No  Endocrine Excessive thirst?: No  Musculoskeletal Back pain?: No Joint pain?: No  Neurological Headaches?: No Dizziness?: No  Psychologic Depression?: No Anxiety?: No  Physical Exam: BP (!) 149/80   Pulse 65   Ht 5\' 1"  (1.549 m)   Wt 112 lb (50.8 kg)   BMI 21.16 kg/m   Constitutional: Well nourished. Alert and oriented, No acute distress. HEENT: [place] AT, moist mucus membranes. Trachea midline, no masses. Cardiovascular: No clubbing, cyanosis, or edema. Respiratory: Normal respiratory effort, no increased work of breathing. GI: Abdomen is soft, non tender, non distended, no abdominal masses. Liver and spleen not palpable.  No hernias appreciated.  Stool sample for occult testing is not indicated.   GU: No CVA tenderness.  No bladder fullness or masses.  Patient with uncircumcised phallus.  Foreskin easily retracted   Urethral meatus is patent.  No penile discharge. No penile lesions or rashes. Scrotum without lesions, cysts, rashes and/or edema.  Testicles are located scrotally bilaterally. No masses are appreciated in the  testicles. Left and right epididymis are normal. Rectal: Patient with  normal sphincter tone. Anus and perineum without scarring or rashes. No rectal masses are appreciated. Prostate is approximately 60 grams, no nodules are appreciated. Seminal vesicles are normal. Skin: No rashes, bruises or suspicious lesions. Lymph: No cervical or inguinal adenopathy. Neurologic: Grossly intact, no focal deficits, moving all 4 extremities. Psychiatric: Normal mood and affect.   Laboratory Data: Lab Results  Component Value Date   WBC 10.3 06/24/2016   HGB 14.3 06/24/2016   HCT 42.2 06/24/2016  MCV 89.1 06/24/2016   PLT 206 06/24/2016    Lab Results  Component Value Date   CREATININE 1.21 06/24/2016   I have reviewed the labs.  Assessment & Plan:    1. History of urinary retention:   Patient suffered a postoperative urinary retention after he suffered a left femur fracture. He was initiated on finasteride. He has been well controlled on that medication.  2. BPH with LUTS:    IPSS score is 7/1, it is mildly worse.  A refill for finasteride is sent to his pharmacy.  He will RTC in one year for IPSS score and exam.     Return in about 1 year (around 01/09/2018) for IPSS and exam.  Michiel CowboySHANNON Milica Gully, [facility] CenterA[facility]C  [facility] [address], [address] [place], [place] [address] [phone]

## 2017-01-09 ENCOUNTER — Ambulatory Visit: Payer: Medicare Other | Admitting: Urology

## 2017-01-09 ENCOUNTER — Encounter: Payer: Self-pay | Admitting: Urology

## 2017-01-09 ENCOUNTER — Other Ambulatory Visit: Payer: Self-pay

## 2017-01-09 VITALS — BP 149/80 | HR 65 | Ht 61.0 in | Wt 112.0 lb

## 2017-01-09 DIAGNOSIS — Z87898 Personal history of other specified conditions: Secondary | ICD-10-CM | POA: Diagnosis not present

## 2017-01-09 DIAGNOSIS — N138 Other obstructive and reflux uropathy: Secondary | ICD-10-CM | POA: Diagnosis not present

## 2017-01-09 DIAGNOSIS — N401 Enlarged prostate with lower urinary tract symptoms: Secondary | ICD-10-CM | POA: Diagnosis not present

## 2017-01-09 MED ORDER — FINASTERIDE 5 MG PO TABS: 5.0000 mg | ORAL_TABLET | Freq: Every day | ORAL | 3 refills | Status: DC

## 2017-01-14 ENCOUNTER — Emergency Department: Payer: Medicare Other

## 2017-01-14 ENCOUNTER — Encounter: Payer: Self-pay | Admitting: Emergency Medicine

## 2017-01-14 ENCOUNTER — Other Ambulatory Visit: Payer: Self-pay

## 2017-01-14 ENCOUNTER — Emergency Department
Admission: EM | Admit: 2017-01-14 | Discharge: 2017-01-15 | Disposition: A | Discharge status: Other Acute Inpatient Hospital | Payer: Medicare Other | Attending: Emergency Medicine | Admitting: Emergency Medicine

## 2017-01-14 DIAGNOSIS — Y92014 Private driveway to single-family (private) house as the place of occurrence of the external cause: Secondary | ICD-10-CM | POA: Diagnosis not present

## 2017-01-14 DIAGNOSIS — F17290 Nicotine dependence, other tobacco product, uncomplicated: Secondary | ICD-10-CM | POA: Insufficient documentation

## 2017-01-14 DIAGNOSIS — I10 Essential (primary) hypertension: Secondary | ICD-10-CM | POA: Insufficient documentation

## 2017-01-14 DIAGNOSIS — Z79899 Other long term (current) drug therapy: Secondary | ICD-10-CM | POA: Insufficient documentation

## 2017-01-14 DIAGNOSIS — Y999 Unspecified external cause status: Secondary | ICD-10-CM | POA: Diagnosis not present

## 2017-01-14 DIAGNOSIS — S065X0A Traumatic subdural hemorrhage without loss of consciousness, initial encounter: Secondary | ICD-10-CM

## 2017-01-14 DIAGNOSIS — I2510 Atherosclerotic heart disease of native coronary artery without angina pectoris: Secondary | ICD-10-CM | POA: Diagnosis not present

## 2017-01-14 DIAGNOSIS — Y9301 Activity, walking, marching and hiking: Secondary | ICD-10-CM | POA: Diagnosis not present

## 2017-01-14 DIAGNOSIS — W010XXA Fall on same level from slipping, tripping and stumbling without subsequent striking against object, initial encounter: Secondary | ICD-10-CM | POA: Insufficient documentation

## 2017-01-14 DIAGNOSIS — J449 Chronic obstructive pulmonary disease, unspecified: Secondary | ICD-10-CM | POA: Diagnosis not present

## 2017-01-14 DIAGNOSIS — Z7982 Long term (current) use of aspirin: Secondary | ICD-10-CM | POA: Diagnosis not present

## 2017-01-14 DIAGNOSIS — R4182 Altered mental status, unspecified: Secondary | ICD-10-CM | POA: Diagnosis not present

## 2017-01-14 DIAGNOSIS — R918 Other nonspecific abnormal finding of lung field: Secondary | ICD-10-CM | POA: Diagnosis not present

## 2017-01-14 DIAGNOSIS — W19XXXA Unspecified fall, initial encounter: Secondary | ICD-10-CM

## 2017-01-14 DIAGNOSIS — S0990XA Unspecified injury of head, initial encounter: Secondary | ICD-10-CM | POA: Diagnosis present

## 2017-01-14 LAB — BASIC METABOLIC PANEL
ANION GAP: 6 (ref 5–15)
BUN: 9 mg/dL (ref 6–20)
CHLORIDE: 102 mmol/L (ref 101–111)
CO2: 26 mmol/L (ref 22–32)
Calcium: 8.4 mg/dL — ABNORMAL LOW (ref 8.9–10.3)
Creatinine, Ser: 1.13 mg/dL (ref 0.61–1.24)
GFR calc Af Amer: >60 mL/min (ref >60)
GFR, EST NON AFRICAN AMERICAN: 55 mL/min — AB (ref >60)
GLUCOSE: 115 mg/dL — AB (ref 65–99)
POTASSIUM: 4.2 mmol/L (ref 3.5–5.1)
Sodium: 134 mmol/L — ABNORMAL LOW (ref 135–145)

## 2017-01-14 LAB — CBC
HEMATOCRIT: 40.5 % (ref 40.0–52.0)
HEMOGLOBIN: 13.6 g/dL (ref 13.0–18.0)
MCH: 30 pg (ref 26.0–34.0)
MCHC: 33.5 g/dL (ref 32.0–36.0)
MCV: 89.4 fL (ref 80.0–100.0)
Platelets: 213 10*3/uL (ref 150–440)
RBC: 4.53 MIL/uL (ref 4.40–5.90)
RDW: 12.7 % (ref 11.5–14.5)
WBC: 8.9 10*3/uL (ref 3.8–10.6)

## 2017-01-14 LAB — TROPONIN I: Troponin I: <0.03 ng/mL (ref <0.03)

## 2017-01-14 NOTE — ED Notes (Signed)
Pt had witnessed fall in driveway (gravel). Hematoma noted to L FA. Skin abrasions noted to L shoulder and L hand. Pt takes 81 ASA daily. Talking quietly. Saying very little. Family at bedside. No hx of dementia. Pt only c/o at this time is he is cold. Covered with warm blankets.

## 2017-01-14 NOTE — ED Triage Notes (Signed)
Pt presents to ED accompanied by family after he had witnessed fall outside. Pt lives alone and was being taken back home when he seemed to have stumbled and fell hitting the left side of his head on the gravel. Pt has multiple abrasions to his left hand, elbow and right arm. Pt family states just after falling he was unresponsive for a few minutes and when he became responsive he has been unable to answer questions appropriately and has been very disoriented. Blood noted to left side of his head. Pt not answering questions.

## 2017-01-14 NOTE — ED Provider Notes (Signed)
[facility] Emergency Department Provider Note  ____________________________________________   First MD Initiated Contact with Patient 01/14/17 2304     (approximate)  I have reviewed the triage vital signs and the nursing notes.   HISTORY  Chief Complaint Head Injury and Fall  Level 5 caveat:  history/ROS limited by altered mental status/confusion and/or acute injury   HPI Eric [name] is a 83 y.o. male who has the medical problems listed below but is relatively healthy and active for his age and continues to live independently with family nearby.  He presents by private vehicle for evaluation of a head injury.  He had a witnessed fall outside on the driveway while ambulating.  His son believes that he tripped due to his chronic shuffling gait and he fell on the concrete or gravel, striking the left side of his head.  He seemed stunned and did not respond immediately.  He has not been "normal" since the fall with minimal interaction even though he is awake and seemingly alert.  He is speaking but only very quietly and is far from his baseline.  He is moving all of his extremities and has the most notable injury to the left side of his head with a large hematoma and abrasion but no other gross deformities.  When I evaluated him he is able to answer simple questions, states that he is cold, and denies neck pain, chest pain, difficulty breathing, abdominal pain, pain in his arms or legs, and nausea.  He is minimally interactive however and ill-appearing.  Past Medical History:  Diagnosis Date  . BPH (benign prostatic hyperplasia)   . CAD (coronary artery disease)   . COPD (chronic obstructive pulmonary disease) (HCC)   . Elevated PSA   . Fracture of left femur (HCC)   . GERD (gastroesophageal reflux disease)   . Hematuria   . HLD (hyperlipidemia)   . Hypertension   . Osteoporosis   . Urinary retention     Patient Active Problem List   Diagnosis Date Noted   . History of urinary retention 01/10/2015  . BPH with obstruction/lower urinary tract symptoms 01/10/2015  . Coronary artery disease 01/07/2011  . Dyslipidemia 01/07/2011  . S/P hip replacement 01/07/2011    Past Surgical History:  Procedure Laterality Date  . APPENDECTOMY    . Arm surgery Right   . carotid surgery stent    . ELBOW SURGERY Right   . HIP SURGERY Left     Prior to Admission medications   Medication Sig Start Date End Date Taking? Authorizing Provider  aspirin EC 81 MG tablet Take 81 mg by mouth daily.    Yes [provider]  atorvastatin (LIPITOR) 20 MG tablet Take 20 mg by mouth daily. 08/08/14  Yes [provider]  finasteride (PROSCAR) 5 MG tablet Take 1 tablet (5 mg total) by mouth daily. 01/09/17  Yes McGowan, Carollee Herter A, PA-C  metoprolol succinate (TOPROL-XL) 25 MG 24 hr tablet Take 1 tablet by mouth daily. 07/22/14  Yes [provider]  nitroGLYCERIN (NITROSTAT) 0.4 MG SL tablet Place 0.4 mg under the tongue every 5 (five) minutes as needed for chest pain.    Yes [provider]    Allergies Eggs or egg-derived products  Family History  Problem Relation Age of Onset  . Stroke Mother   . Heart attack Father   . Prostate cancer Neg Hx   . Bladder Cancer Neg Hx     Social History Social History  Tobacco Use  . Smoking status: Current Every Day Smoker    Types: Pipe  . Smokeless tobacco: Never Used  Substance Use Topics  . Alcohol use: No    Alcohol/week: 0.0 oz  . Drug use: No    Review of Systems Level 5 caveat:  history/ROS limited by altered mental status/confusion and/or acute injury.  However, the patient denies neck pain, chest pain, abdominal pain, difficulty breathing, and pain in his extremities. ____________________________________________   PHYSICAL EXAM:  VITAL SIGNS: ED Triage Vitals  Enc Vitals Group     BP 01/14/17 2253 (!) 161/74     Pulse Rate 01/14/17 2253 71     Resp 01/14/17 2253 18      Temp 01/14/17 2253 97.7 F (36.5 C)     Temp Source 01/14/17 2253 Oral     SpO2 01/14/17 2253 99 %     Weight 01/14/17 2254 53.1 kg (117 lb)     Height 01/14/17 2254 1.549 m (5\' 1" )     Head Circumference --      Peak Flow --      Pain Score --      Pain Loc --      Pain Edu? --      Excl. in GC? --     Constitutional: Alert but confused, off his baseline per family.  Elderly, somewhat ill-appearing at this time.  The patient is shivering and reports that he is cold. Eyes: Conjunctivae are normal.  Head: Hematoma to left temporal region with abrasion, no active bleeding at this time. Nose: No congestion/rhinnorhea. Mouth/Throat: Mucous membranes are moist. Neck: No stridor.  No meningeal signs.  No cervical spine tenderness to palpation. Cardiovascular: Normal rate, regular rhythm. Good peripheral circulation. Grossly normal heart sounds. Respiratory: Normal respiratory effort.  No retractions. Lungs CTAB. Gastrointestinal: Thin body habitus.  Soft and nontender. No distention.  Musculoskeletal: No lower extremity tenderness nor edema. No gross deformities of extremities. Neurologic: Quiet speech but responding to simple questions.  Moving all 4 extremities with no evidence of an acute neurological deficit but patient is not able to fully participate in a neurological exam. Skin:  multiple superficial abrasions   ____________________________________________   LABS (all labs ordered are listed, but only abnormal results are displayed)  Labs Reviewed  BASIC METABOLIC PANEL - Abnormal; Notable for the following components:      Result Value   Sodium 134 (*)    Glucose, Bld 115 (*)    Calcium 8.4 (*)    GFR calc non Af Amer 55 (*)    All other components within normal limits  CBC  TROPONIN I  URINALYSIS, COMPLETE (UACMP) WITH MICROSCOPIC  CBG MONITORING, ED   ____________________________________________  EKG  ED ECG REPORT I, Loleta Roseory Lavella Myren, the attending physician,  personally viewed and interpreted this ECG.  Date: 01/14/2017 EKG Time: 23: 16 Rate: 78 Rhythm: normal sinus rhythm QRS Axis: normal Intervals: normal ST/T Wave abnormalities: Non-specific ST segment / T-wave changes, but no evidence of acute ischemia. Narrative Interpretation: no evidence of acute ischemia.  Artifact is present due to the patient's shivering but the underlying rhythm appears to be sinus with no gross abnormalities or ST segment elevation nor depression  ____________________________________________  RADIOLOGY I, Loleta Roseory Meliya Mcconahy, personally discussed these images and results by phone with the on-call radiologist and used this discussion as part of my medical decision making.    Ct Head Wo Contrast  Result Date: 01/14/2017 CLINICAL DATA:  Status post fall, hitting  left side of head on gravel. Concern for head or cervical spine injury. Patient unresponsive and disoriented. EXAM: CT HEAD WITHOUT CONTRAST CT CERVICAL SPINE WITHOUT CONTRAST TECHNIQUE: Multidetector CT imaging of the head and cervical spine was performed following the standard protocol without intravenous contrast. Multiplanar CT image reconstructions of the cervical spine were also generated. COMPARISON:  CT of the head performed 03/15/2014 FINDINGS: CT HEAD FINDINGS Brain: There is acute extra-axial hemorrhage overlying the left parietal lobe. This likely reflects a subdural hematoma, measuring up to 7 mm in thickness. Mild mass effect is seen, without significant midline shift. No overlying fracture is identified. Prominence of the ventricles and sulci reflects mild to moderate cortical volume loss. Cerebellar atrophy is noted. Mild periventricular white matter change likely reflects small vessel ischemic microangiopathy. No evidence of acute infarction, hydrocephalus or mass lesion. The brainstem and fourth ventricle are within normal limits. The basal ganglia are unremarkable in appearance. The cerebral hemispheres  demonstrate grossly normal gray-white differentiation. Vascular: No hyperdense vessel or unexpected calcification. Skull: There is no evidence of fracture; visualized osseous structures are unremarkable in appearance. Sinuses/Orbits: The visualized portions of the orbits are within normal limits. The paranasal sinuses and mastoid air cells are well-aerated. Other: Soft tissue swelling is noted overlying the left frontoparietal calvarium. CT CERVICAL SPINE FINDINGS Alignment: Normal. Skull base and vertebrae: No acute fracture. No primary bone lesion or focal pathologic process. Soft tissues and spinal canal: No prevertebral fluid or swelling. No visible canal hematoma. Disc levels: Multilevel vacuum phenomenon is noted along the cervical spine, with anterior bridging osteophytes noted along the lower cervical and upper thoracic spine. Upper chest: There is scattered opacification of the bronchioles at the lung apices, with suggestion of slight enlargement. This could reflect a fungal or other atypical infection. Mild scarring is noted at the right lung apex, with associated calcification. The thyroid gland is unremarkable in appearance. Mild calcification is noted at the carotid bifurcations bilaterally. Other: No additional soft tissue abnormalities are seen. IMPRESSION: 1. Acute extra-axial hemorrhage overlying the left parietal lobe. This likely reflects a subdural hematoma, measuring up to 7 mm in thickness. Mild mass effect, without significant midline shift. Follow-up imaging is warranted to ensure stability and resolution. 2. No evidence of fracture or subluxation along the cervical spine. 3. Soft tissue swelling overlying the left frontoparietal calvarium. 4. Scattered opacification of the bronchioles at the lung apices, with suggestion of slight bronchiolar enlargement. This could reflect a fungal or other atypical infection. Would correlate with the patient's symptoms. 5. Mild to moderate cortical volume  loss and scattered small vessel ischemic microangiopathy. 6. Mild degenerative change along the cervical spine. 7. Mild calcification at the carotid bifurcations bilaterally. Critical Value/emergent results were called by telephone at the time of interpretation on 01/14/2017 at 11:52 pm to Dr. Loleta Rose, who verbally acknowledged these results. Electronically Signed   By: Roanna Raider M.D.   On: 01/14/2017 23:55   Ct Cervical Spine Wo Contrast  Result Date: 01/14/2017 CLINICAL DATA:  Status post fall, hitting left side of head on gravel. Concern for head or cervical spine injury. Patient unresponsive and disoriented. EXAM: CT HEAD WITHOUT CONTRAST CT CERVICAL SPINE WITHOUT CONTRAST TECHNIQUE: Multidetector CT imaging of the head and cervical spine was performed following the standard protocol without intravenous contrast. Multiplanar CT image reconstructions of the cervical spine were also generated. COMPARISON:  CT of the head performed 03/15/2014 FINDINGS: CT HEAD FINDINGS Brain: There is acute extra-axial hemorrhage overlying the left parietal  lobe. This likely reflects a subdural hematoma, measuring up to 7 mm in thickness. Mild mass effect is seen, without significant midline shift. No overlying fracture is identified. Prominence of the ventricles and sulci reflects mild to moderate cortical volume loss. Cerebellar atrophy is noted. Mild periventricular white matter change likely reflects small vessel ischemic microangiopathy. No evidence of acute infarction, hydrocephalus or mass lesion. The brainstem and fourth ventricle are within normal limits. The basal ganglia are unremarkable in appearance. The cerebral hemispheres demonstrate grossly normal gray-white differentiation. Vascular: No hyperdense vessel or unexpected calcification. Skull: There is no evidence of fracture; visualized osseous structures are unremarkable in appearance. Sinuses/Orbits: The visualized portions of the orbits are within  normal limits. The paranasal sinuses and mastoid air cells are well-aerated. Other: Soft tissue swelling is noted overlying the left frontoparietal calvarium. CT CERVICAL SPINE FINDINGS Alignment: Normal. Skull base and vertebrae: No acute fracture. No primary bone lesion or focal pathologic process. Soft tissues and spinal canal: No prevertebral fluid or swelling. No visible canal hematoma. Disc levels: Multilevel vacuum phenomenon is noted along the cervical spine, with anterior bridging osteophytes noted along the lower cervical and upper thoracic spine. Upper chest: There is scattered opacification of the bronchioles at the lung apices, with suggestion of slight enlargement. This could reflect a fungal or other atypical infection. Mild scarring is noted at the right lung apex, with associated calcification. The thyroid gland is unremarkable in appearance. Mild calcification is noted at the carotid bifurcations bilaterally. Other: No additional soft tissue abnormalities are seen. IMPRESSION: 1. Acute extra-axial hemorrhage overlying the left parietal lobe. This likely reflects a subdural hematoma, measuring up to 7 mm in thickness. Mild mass effect, without significant midline shift. Follow-up imaging is warranted to ensure stability and resolution. 2. No evidence of fracture or subluxation along the cervical spine. 3. Soft tissue swelling overlying the left frontoparietal calvarium. 4. Scattered opacification of the bronchioles at the lung apices, with suggestion of slight bronchiolar enlargement. This could reflect a fungal or other atypical infection. Would correlate with the patient's symptoms. 5. Mild to moderate cortical volume loss and scattered small vessel ischemic microangiopathy. 6. Mild degenerative change along the cervical spine. 7. Mild calcification at the carotid bifurcations bilaterally. Critical Value/emergent results were called by telephone at the time of interpretation on 01/14/2017 at 11:52  pm to Dr. Loleta Rose, who verbally acknowledged these results. Electronically Signed   By: Roanna Raider M.D.   On: 01/14/2017 23:55    ____________________________________________   PROCEDURES  Critical Care performed: Yes, see critical care procedure note(s)   Procedure(s) performed:   .Critical Care Performed by: Loleta Rose, MD Authorized by: Loleta Rose, MD   Critical care provider statement:    Critical care time (minutes):  45   Critical care time was exclusive of:  Separately billable procedures and treating other patients   Critical care was necessary to treat or prevent imminent or life-threatening deterioration of the following conditions:  Trauma and CNS failure or compromise   Critical care was time spent personally by me on the following activities:  Development of treatment plan with patient or surrogate, discussions with consultants, evaluation of patient's response to treatment, examination of patient, obtaining history from patient or surrogate, ordering and performing treatments and interventions, ordering and review of laboratory studies, ordering and review of radiographic studies, pulse oximetry, re-evaluation of patient's condition and review of old charts     ____________________________________________   INITIAL IMPRESSION / ASSESSMENT AND PLAN / ED  COURSE  As part of my medical decision making, I reviewed the following data within the electronic MEDICAL RECORD NUMBER History obtained from family, Nursing notes reviewed and incorporated, Labs reviewed , Discussed with admitting physician (Dr. Zachery ConchFriedman, [facility]) and A consult was requested and obtained from this/these consultant(s) [facility])    Differential diagnosis includes, but is not limited to, acute traumatic intracranial bleeding, CVA, ACS, and all the associated injuries that could accompany an elderly fall onto a hard surface.  Given his altered mental status from baseline and  obvious head injury, CT scan of his head and C-spine take first priority.  The patient's EKG is reassuring with no evidence of acute ischemia.  Blood work is pending.  The patient is on the monitor and his vital signs are stable at this point.  His mental status seems to have improved from the time he came to triage because he was not speaking at all at that time and he is now answering simple questions and denying any pain, just reporting that he is cold although he was not down outside for an extended period of time.  We will continue to monitor closely.  I have spoken with his Son and daughter-in-law at bedside and will keep him updated.   Clinical Course as of Jan 16 147  Wed Jan 15, 2017  0000 I spoke by phone with Dr. Cherly Hensenhang with radiology who informed me that the patient has an acute traumatic subdural hematoma.  I updated the patient and family.  He is answering questions a little bit better than he was previously but still is not at his baseline mental status.  He continues to have no tenderness to palpation of his neck and no focal neurological deficits.  His CT cervical spine showed no evidence of acute injury so I will not put him in a collar at this time.  I updated the patient and family about the results and they said that he has been to [facility] in the past for cardiac stents. CT HEAD WO CONTRAST [CF]  0003 [facility] and will call back  [CF]  0004 Radiology mentioned incidental finding of abnormal pattern in lung apices, possibly reflecting fungal infection.  Will need in-hospital follow up after transfer.  [CF]  [phone] Spoke with [facility] who accepted patient.  Arranging next available transportation option  [CF]  [phone] [facility] will update us with ETA on helicopter transport  [CF]  [phone] Reassessed patient prior to helicopter arrival.  No neuro status change.    [CF]    Clinical Course User Index [CF] Loleta RoseForbach, Riker Collier, MD     ____________________________________________  FINAL CLINICAL IMPRESSION(S) / ED DIAGNOSES  Final diagnoses:  Traumatic subdural hematoma without loss of consciousness, initial encounter (HCC)  Fall, initial encounter  Altered mental status, unspecified altered mental status type  Abnormal CT scan, lung     MEDICATIONS GIVEN DURING THIS VISIT:  Medications - No data to display   ED Discharge Orders    None       Note:  This document was prepared using Dragon voice recognition software and may include unintentional dictation errors.    Loleta RoseForbach, Kameron Glazebrook, MD 01/15/17 [phone]

## 2017-01-15 NOTE — ED Notes (Signed)
EMTALA checked for completion  

## 2017-01-17 ENCOUNTER — Encounter
Admission: RE | Admit: 2017-01-17 | Discharge: 2017-01-17 | Disposition: A | Discharge status: Home / Self Care | Payer: Medicare Other | Source: Ambulatory Visit | Attending: Internal Medicine | Admitting: Internal Medicine

## 2017-01-19 MED ORDER — ATORVASTATIN CALCIUM 10 MG PO TABS
20.00 | ORAL_TABLET | ORAL | Status: DC
Start: 2017-01-18 — End: 2017-01-19

## 2017-01-19 MED ORDER — LIDOCAINE 5 % EX PTCH
1.00 | MEDICATED_PATCH | CUTANEOUS | Status: DC
Start: 2017-01-18 — End: 2017-01-19

## 2017-01-19 MED ORDER — GENERIC EXTERNAL MEDICATION
12.50 | Status: DC
Start: 2017-01-18 — End: 2017-01-19

## 2017-01-19 MED ORDER — LABETALOL HCL 5 MG/ML IV SOLN
10.00 | INTRAVENOUS | Status: DC
Start: ? — End: 2017-01-19

## 2017-01-19 MED ORDER — HYDRALAZINE HCL 20 MG/ML IJ SOLN
10.00 | INTRAMUSCULAR | Status: DC
Start: ? — End: 2017-01-19

## 2017-01-19 MED ORDER — LIDOCAINE HCL (PF) 1 % IJ SOLN
0.50 | INTRAMUSCULAR | Status: DC
Start: ? — End: 2017-01-19

## 2017-01-19 MED ORDER — SENNOSIDES-DOCUSATE SODIUM 8.6-50 MG PO TABS
2.00 | ORAL_TABLET | ORAL | Status: DC
Start: 2017-01-18 — End: 2017-01-19

## 2017-01-19 MED ORDER — SODIUM CHLORIDE 0.9 % IJ SOLN
5.00 | INTRAMUSCULAR | Status: DC
Start: 2017-01-17 — End: 2017-01-19

## 2017-01-19 MED ORDER — SODIUM CHLORIDE 0.9 % IJ SOLN
5.00 | INTRAMUSCULAR | Status: DC
Start: 2017-01-18 — End: 2017-01-19

## 2017-01-19 MED ORDER — ACETAMINOPHEN 160 MG/5ML PO SUSP
975.00 | ORAL | Status: DC
Start: 2017-01-18 — End: 2017-01-19

## 2017-01-19 MED ORDER — FINASTERIDE 5 MG PO TABS
5.00 | ORAL_TABLET | ORAL | Status: DC
Start: 2017-01-18 — End: 2017-01-19

## 2017-01-19 MED ORDER — POLYETHYLENE GLYCOL 3350 17 G PO PACK
17.00 | PACK | ORAL | Status: DC
Start: 2017-01-18 — End: 2017-01-19

## 2017-01-21 ENCOUNTER — Encounter
Admission: RE | Admit: 2017-01-21 | Discharge: 2017-01-21 | Disposition: A | Discharge status: Home / Self Care | Payer: Medicare Other | Source: Ambulatory Visit | Attending: Internal Medicine | Admitting: Internal Medicine

## 2017-01-30 ENCOUNTER — Non-Acute Institutional Stay (SKILLED_NURSING_FACILITY): Payer: Medicare Other | Admitting: Gerontology

## 2017-01-30 DIAGNOSIS — G301 Alzheimer's disease with late onset: Secondary | ICD-10-CM

## 2017-01-30 DIAGNOSIS — F0280 Dementia in other diseases classified elsewhere without behavioral disturbance: Secondary | ICD-10-CM

## 2017-01-30 DIAGNOSIS — S065X9A Traumatic subdural hemorrhage with loss of consciousness of unspecified duration, initial encounter: Secondary | ICD-10-CM

## 2017-01-30 DIAGNOSIS — S065XAA Traumatic subdural hemorrhage with loss of consciousness status unknown, initial encounter: Secondary | ICD-10-CM

## 2017-01-31 ENCOUNTER — Encounter: Payer: Self-pay | Admitting: Gerontology

## 2017-01-31 NOTE — Progress Notes (Signed)
Location:   [facility] Room Number: [place] of Service:  [phone]) Provider:  Toni Arthurs, NP-C  [place], Leonie Douglas, MD  Patient Care Team: Idelle Crouch, MD as PCP - General (Internal Medicine)  Extended Emergency Contact Information Primary Emergency Contact: Jamey Ripa [place] Phone: [phone] Relation: Daughter Secondary Emergency Contact: Olafson,Jimmy  [place] Phone: [phone] Relation: Son  Code Status: FULL Goals of care: Advanced Directive information Advanced Directives 01/30/2017  Does Patient Have a Medical Advance Directive? No     Chief Complaint  Patient presents with  . Medical Management of Chronic Issues    Routine Visit    HPI:  Pt is a 84 y.o. male seen today for medical management of chronic diseases.  Patient was admitted to the facility for rehab following admission to [facility] for a subdural hematoma, which patient obtained from mechanical fall in his driveway, hitting the left side of his head on the concrete.  Treatment was conservative.  Repeat head CT obtained prior to transfer to the facility shows stability of the SDH.  Patient also has Alzheimer's disease.  He was independent at home prior to the head injury.  Now, patient requires assistance with ADLs and bathing.  Patient is mobile on the unit with wheelchair.  Denies pain at this time.  Intermittently confused.  Patient is typically eating less than 50% of his meals.  Will monitor for weight loss.  Vital signs stable.  No other complaints.  Please note pt with limited verbal ability. Unable to obtain complete ROS. Some ROS info obtained from staff and documentation.      Past Medical History:  Diagnosis Date  . Arteriosclerotic cardiovascular disease    status post MI with PTCA and stent placement 2012  . BPH (benign prostatic hyperplasia)   . CAD (coronary artery disease)   . COPD (chronic  obstructive pulmonary disease) ([place])   . Elevated PSA   . Fracture of left femur ([place])   . GERD (gastroesophageal reflux disease)   . Heart attack ([place])   . Hematuria   . HLD (hyperlipidemia)   . Hyperlipidemia, unspecified   . Hypertension   . Osteoporosis   . Urinary retention    Past Surgical History:  Procedure Laterality Date  . APPENDECTOMY    . Arm surgery Right   . CARDIAC CATHETERIZATION    . carotid surgery stent    . CORONARY ANGIOPLASTY    . ELBOW SURGERY Right   . HERNIA REPAIR     right inguinal   . HIP SURGERY Left 2006   left hip replacement    Allergies  Allergen Reactions  . Eggs Or Egg-Derived Products Nausea And Vomiting    Allergies as of 01/30/2017      Reactions   Eggs Or Egg-derived Products Nausea And Vomiting      Medication List        Accurate as of 01/30/17 11:59 PM. Always use your most recent med list.          aspirin EC 81 MG tablet Take 81 mg by mouth daily.   atorvastatin 20 MG tablet Commonly known as:  LIPITOR Take 20 mg by mouth daily.   ENSURE Take 237 mLs by mouth 2 (two) times daily between meals.   finasteride 5 MG tablet Commonly known as:  PROSCAR Take 1 tablet (5 mg total) by mouth daily.   ketotifen 0.025 % ophthalmic solution  Commonly known as:  ZADITOR Place 1 drop into both eyes every 12 (twelve) hours.   lactulose 10 GM/15ML solution Commonly known as:  CHRONULAC Take 10 g by mouth 2 (two) times daily.   metoprolol succinate 25 MG 24 hr tablet Commonly known as:  TOPROL-XL Take 12.5 mg by mouth 2 (two) times daily.   modafinil 100 MG tablet Commonly known as:  PROVIGIL Take 100 mg by mouth daily.   nitroGLYCERIN 0.4 MG SL tablet Commonly known as:  NITROSTAT Place 0.4 mg under the tongue every 5 (five) minutes as needed for chest pain.   SENNA S 8.6-50 MG tablet Generic drug:  senna-docusate Take 2 tablets by mouth 2 (two) times daily.       Review of Systems  Unable to perform ROS:  Dementia  Constitutional: Positive for activity change, appetite change and fatigue. Negative for chills, diaphoresis and fever.  HENT: Negative for congestion, mouth sores, nosebleeds, postnasal drip, sneezing, sore throat, trouble swallowing and voice change.   Respiratory: Negative for apnea, cough, choking, chest tightness, shortness of breath and wheezing.   Cardiovascular: Negative for chest pain, palpitations and leg swelling.  Gastrointestinal: Negative for abdominal distention, abdominal pain, constipation, diarrhea and nausea.  Genitourinary: Negative for difficulty urinating, dysuria, frequency and urgency.  Musculoskeletal: Positive for arthralgias (typical arthritis). Negative for back pain, gait problem and myalgias.  Skin: Negative for color change, pallor, rash and wound.  Neurological: Positive for weakness. Negative for dizziness, tremors, syncope, speech difficulty, numbness and headaches.  Psychiatric/Behavioral: Negative for agitation and behavioral problems.  All other systems reviewed and are negative.    There is no immunization history on file for this patient. Pertinent  Health Maintenance Due  Topic Date Due  . PNA vac Low Risk Adult (1 of 2 - PCV13) 10/18/1990  . INFLUENZA VACCINE  08/21/2016   No flowsheet data found. Functional Status Survey:    Vitals:   01/30/17 1206  BP: 136/76  Pulse: 80  Resp: 20  Temp: 98.1 F (36.7 C)  TempSrc: Oral  SpO2: 97%  Weight: 114 lb 9.6 oz (52 kg)  Height: 5' 1"  (1.549 m)   Body mass index is 21.65 kg/m. Physical Exam  Constitutional: He is oriented to person, place, and time. Vital signs are normal. He appears cachectic. He is active and cooperative. He appears ill. No distress.  HENT:  Head: Normocephalic and atraumatic.  Mouth/Throat: Uvula is midline, oropharynx is clear and moist and mucous membranes are normal. Mucous membranes are not pale, not dry and not cyanotic.  Eyes: Conjunctivae, EOM and lids are  normal. Pupils are equal, round, and reactive to light.  Neck: Trachea normal, normal range of motion and full passive range of motion without pain. Neck supple. No JVD present. No tracheal deviation, no edema and no erythema present. No thyromegaly present.  Cardiovascular: Normal rate, regular rhythm, normal heart sounds, intact distal pulses and normal pulses. Exam reveals no gallop, no distant heart sounds and no friction rub.  No murmur heard. Pulses:      Dorsalis pedis pulses are 2+ on the right side, and 2+ on the left side.  No edema  Pulmonary/Chest: Effort normal and breath sounds normal. No accessory muscle usage. No respiratory distress. He has no decreased breath sounds. He has no wheezes. He has no rhonchi. He has no rales. He exhibits no tenderness.  Abdominal: Soft. Normal appearance and bowel sounds are normal. He exhibits no distension and no ascites. There is no  tenderness.  Musculoskeletal: Normal range of motion. He exhibits no edema or tenderness.  Expected osteoarthritis, stiffness; Bilateral Calves soft, supple. Negative Homan's Sign. B- pedal pulses equal; generalized weakness  Neurological: He is alert and oriented to person, place, and time. He has normal strength. He displays atrophy. A cranial nerve deficit and sensory deficit is present. He exhibits abnormal muscle tone. Coordination and gait abnormal.  Skin: Skin is warm, dry and intact. He is not diaphoretic. No cyanosis. No pallor. Nails show no clubbing.  Psychiatric: He has a normal mood and affect. His speech is normal and behavior is normal. Judgment and thought content normal. Cognition and memory are impaired. He exhibits abnormal recent memory.  Nursing note and vitals reviewed.   Labs reviewed: Recent Labs    06/24/16 1946 01/14/17 2307  NA 134* 134*  K 4.4 4.2  CL 99* 102  CO2 27 26  GLUCOSE 110* 115*  BUN 12 9  CREATININE 1.21 1.13  CALCIUM 8.6* 8.4*   Recent Labs    06/24/16 1946  AST 26   ALT 12*  ALKPHOS 119  BILITOT 1.0  PROT 7.2  ALBUMIN 3.6   Recent Labs    06/24/16 1946 01/14/17 2307  WBC 10.3 8.9  HGB 14.3 13.6  HCT 42.2 40.5  MCV 89.1 89.4  PLT 206 213   No results found for: TSH No results found for: HGBA1C No results found for: CHOL, HDL, LDLCALC, LDLDIRECT, TRIG, CHOLHDL  Significant Diagnostic Results in last 30 days:  Ct Head Wo Contrast  Result Date: 01/14/2017 CLINICAL DATA:  Status post fall, hitting left side of head on gravel. Concern for head or cervical spine injury. Patient unresponsive and disoriented. EXAM: CT HEAD WITHOUT CONTRAST CT CERVICAL SPINE WITHOUT CONTRAST TECHNIQUE: Multidetector CT imaging of the head and cervical spine was performed following the standard protocol without intravenous contrast. Multiplanar CT image reconstructions of the cervical spine were also generated. COMPARISON:  CT of the head performed 03/15/2014 FINDINGS: CT HEAD FINDINGS Brain: There is acute extra-axial hemorrhage overlying the left parietal lobe. This likely reflects a subdural hematoma, measuring up to 7 mm in thickness. Mild mass effect is seen, without significant midline shift. No overlying fracture is identified. Prominence of the ventricles and sulci reflects mild to moderate cortical volume loss. Cerebellar atrophy is noted. Mild periventricular white matter change likely reflects small vessel ischemic microangiopathy. No evidence of acute infarction, hydrocephalus or mass lesion. The brainstem and fourth ventricle are within normal limits. The basal ganglia are unremarkable in appearance. The cerebral hemispheres demonstrate grossly normal gray-white differentiation. Vascular: No hyperdense vessel or unexpected calcification. Skull: There is no evidence of fracture; visualized osseous structures are unremarkable in appearance. Sinuses/Orbits: The visualized portions of the orbits are within normal limits. The paranasal sinuses and mastoid air cells are  well-aerated. Other: Soft tissue swelling is noted overlying the left frontoparietal calvarium. CT CERVICAL SPINE FINDINGS Alignment: Normal. Skull base and vertebrae: No acute fracture. No primary bone lesion or focal pathologic process. Soft tissues and spinal canal: No prevertebral fluid or swelling. No visible canal hematoma. Disc levels: Multilevel vacuum phenomenon is noted along the cervical spine, with anterior bridging osteophytes noted along the lower cervical and upper thoracic spine. Upper chest: There is scattered opacification of the bronchioles at the lung apices, with suggestion of slight enlargement. This could reflect a fungal or other atypical infection. Mild scarring is noted at the right lung apex, with associated calcification. The thyroid gland is unremarkable in  appearance. Mild calcification is noted at the carotid bifurcations bilaterally. Other: No additional soft tissue abnormalities are seen. IMPRESSION: 1. Acute extra-axial hemorrhage overlying the left parietal lobe. This likely reflects a subdural hematoma, measuring up to 7 mm in thickness. Mild mass effect, without significant midline shift. Follow-up imaging is warranted to ensure stability and resolution. 2. No evidence of fracture or subluxation along the cervical spine. 3. Soft tissue swelling overlying the left frontoparietal calvarium. 4. Scattered opacification of the bronchioles at the lung apices, with suggestion of slight bronchiolar enlargement. This could reflect a fungal or other atypical infection. Would correlate with the patient's symptoms. 5. Mild to moderate cortical volume loss and scattered small vessel ischemic microangiopathy. 6. Mild degenerative change along the cervical spine. 7. Mild calcification at the carotid bifurcations bilaterally. Critical Value/emergent results were called by telephone at the time of interpretation on 01/14/2017 at 11:52 pm to Dr. Hinda Kehr, who verbally acknowledged these  results. Electronically Signed   By: Garald Balding M.D.   On: 01/14/2017 23:55   Ct Cervical Spine Wo Contrast  Result Date: 01/14/2017 CLINICAL DATA:  Status post fall, hitting left side of head on gravel. Concern for head or cervical spine injury. Patient unresponsive and disoriented. EXAM: CT HEAD WITHOUT CONTRAST CT CERVICAL SPINE WITHOUT CONTRAST TECHNIQUE: Multidetector CT imaging of the head and cervical spine was performed following the standard protocol without intravenous contrast. Multiplanar CT image reconstructions of the cervical spine were also generated. COMPARISON:  CT of the head performed 03/15/2014 FINDINGS: CT HEAD FINDINGS Brain: There is acute extra-axial hemorrhage overlying the left parietal lobe. This likely reflects a subdural hematoma, measuring up to 7 mm in thickness. Mild mass effect is seen, without significant midline shift. No overlying fracture is identified. Prominence of the ventricles and sulci reflects mild to moderate cortical volume loss. Cerebellar atrophy is noted. Mild periventricular white matter change likely reflects small vessel ischemic microangiopathy. No evidence of acute infarction, hydrocephalus or mass lesion. The brainstem and fourth ventricle are within normal limits. The basal ganglia are unremarkable in appearance. The cerebral hemispheres demonstrate grossly normal gray-white differentiation. Vascular: No hyperdense vessel or unexpected calcification. Skull: There is no evidence of fracture; visualized osseous structures are unremarkable in appearance. Sinuses/Orbits: The visualized portions of the orbits are within normal limits. The paranasal sinuses and mastoid air cells are well-aerated. Other: Soft tissue swelling is noted overlying the left frontoparietal calvarium. CT CERVICAL SPINE FINDINGS Alignment: Normal. Skull base and vertebrae: No acute fracture. No primary bone lesion or focal pathologic process. Soft tissues and spinal canal: No  prevertebral fluid or swelling. No visible canal hematoma. Disc levels: Multilevel vacuum phenomenon is noted along the cervical spine, with anterior bridging osteophytes noted along the lower cervical and upper thoracic spine. Upper chest: There is scattered opacification of the bronchioles at the lung apices, with suggestion of slight enlargement. This could reflect a fungal or other atypical infection. Mild scarring is noted at the right lung apex, with associated calcification. The thyroid gland is unremarkable in appearance. Mild calcification is noted at the carotid bifurcations bilaterally. Other: No additional soft tissue abnormalities are seen. IMPRESSION: 1. Acute extra-axial hemorrhage overlying the left parietal lobe. This likely reflects a subdural hematoma, measuring up to 7 mm in thickness. Mild mass effect, without significant midline shift. Follow-up imaging is warranted to ensure stability and resolution. 2. No evidence of fracture or subluxation along the cervical spine. 3. Soft tissue swelling overlying the left frontoparietal calvarium. 4. Scattered  opacification of the bronchioles at the lung apices, with suggestion of slight bronchiolar enlargement. This could reflect a fungal or other atypical infection. Would correlate with the patient's symptoms. 5. Mild to moderate cortical volume loss and scattered small vessel ischemic microangiopathy. 6. Mild degenerative change along the cervical spine. 7. Mild calcification at the carotid bifurcations bilaterally. Critical Value/emergent results were called by telephone at the time of interpretation on 01/14/2017 at 11:52 pm to Dr. Hinda Kehr, who verbally acknowledged these results. Electronically Signed   By: Garald Balding M.D.   On: 01/14/2017 23:55    Assessment/Plan Subdural hematoma  Continue participating with PT/OT  Continue exercises taught by PT/OT  Monitor for pain  Monitor for behavioral alterations  Monitor for worsening  LOC  Follow-up with neurologist as instructed  No anticoagulants at this time  Monitor closely for safety  Late onset Alzheimer's disease without behavioral disturbance Assist with ADLs as needed Assist with meals and meal set up as needed Monitor closely for safety Encourage patient to interact with other patients and participate in activities Have patient in the common area frequently, but allowing for frequent rest breaks  Family/ staff Communication:   Total Time:  Documentation:  Face to Face:  Family/Phone:   Labs/tests ordered: CBC, met C, TSH, magnesium, vitamin B12, vitamin D  Medication list reviewed and assessed for continued appropriateness. Monthly medication orders reviewed and signed.  Vikki Ports, NP-C Geriatrics [facility] [phone], [place] [address] Cell Phone (Mon-Fri 8am-5pm):  [phone] On Call:  [phone] & follow prompts after 5pm & weekends Office Phone:  [phone] Office Fax:  [phone]

## 2017-02-10 DIAGNOSIS — F0280 Dementia in other diseases classified elsewhere without behavioral disturbance: Secondary | ICD-10-CM | POA: Insufficient documentation

## 2017-02-10 DIAGNOSIS — G301 Alzheimer's disease with late onset: Secondary | ICD-10-CM

## 2017-02-10 DIAGNOSIS — S065X9A Traumatic subdural hemorrhage with loss of consciousness of unspecified duration, initial encounter: Secondary | ICD-10-CM | POA: Insufficient documentation

## 2017-02-10 DIAGNOSIS — S065XAA Traumatic subdural hemorrhage with loss of consciousness status unknown, initial encounter: Secondary | ICD-10-CM | POA: Insufficient documentation

## 2017-02-11 ENCOUNTER — Other Ambulatory Visit
Admission: RE | Admit: 2017-02-11 | Discharge: 2017-02-11 | Disposition: A | Discharge status: Home / Self Care | Payer: Medicare Other | Source: Ambulatory Visit | Attending: Gerontology | Admitting: Gerontology

## 2017-02-11 DIAGNOSIS — I2510 Atherosclerotic heart disease of native coronary artery without angina pectoris: Secondary | ICD-10-CM | POA: Diagnosis present

## 2017-02-11 LAB — CBC WITH DIFFERENTIAL/PLATELET
Basophils Absolute: 0.1 10*3/uL (ref 0–0.1)
Basophils Relative: 1 %
Eosinophils Absolute: 0.5 10*3/uL (ref 0–0.7)
Eosinophils Relative: 5 %
HEMATOCRIT: 39.8 % — AB (ref 40.0–52.0)
HEMOGLOBIN: 13.2 g/dL (ref 13.0–18.0)
LYMPHS ABS: 1.6 10*3/uL (ref 1.0–3.6)
LYMPHS PCT: 17 %
MCH: 30 pg (ref 26.0–34.0)
MCHC: 33.3 g/dL (ref 32.0–36.0)
MCV: 90.2 fL (ref 80.0–100.0)
MONO ABS: 0.8 10*3/uL (ref 0.2–1.0)
MONOS PCT: 8 %
NEUTROS ABS: 6.5 10*3/uL (ref 1.4–6.5)
NEUTROS PCT: 69 %
Platelets: 207 10*3/uL (ref 150–440)
RBC: 4.41 MIL/uL (ref 4.40–5.90)
RDW: 13.3 % (ref 11.5–14.5)
WBC: 9.4 10*3/uL (ref 3.8–10.6)

## 2017-02-11 LAB — COMPREHENSIVE METABOLIC PANEL
ALBUMIN: 3.3 g/dL — AB (ref 3.5–5.0)
ALT: 19 U/L (ref 17–63)
AST: 29 U/L (ref 15–41)
Alkaline Phosphatase: 94 U/L (ref 38–126)
Anion gap: 11 (ref 5–15)
BILIRUBIN TOTAL: 0.8 mg/dL (ref 0.3–1.2)
BUN: 29 mg/dL — AB (ref 6–20)
CO2: 27 mmol/L (ref 22–32)
CREATININE: 1.04 mg/dL (ref 0.61–1.24)
Calcium: 9 mg/dL (ref 8.9–10.3)
Chloride: 104 mmol/L (ref 101–111)
GFR calc Af Amer: >60 mL/min (ref >60)
GLUCOSE: 75 mg/dL (ref 65–99)
POTASSIUM: 3.7 mmol/L (ref 3.5–5.1)
Sodium: 142 mmol/L (ref 135–145)
TOTAL PROTEIN: 6.7 g/dL (ref 6.5–8.1)

## 2017-02-11 LAB — VITAMIN B12: Vitamin B-12: 638 pg/mL (ref 180–914)

## 2017-02-11 LAB — MAGNESIUM: Magnesium: 1.9 mg/dL (ref 1.7–2.4)

## 2017-02-11 LAB — TSH: TSH: 2.214 u[IU]/mL (ref 0.350–4.500)

## 2017-02-12 ENCOUNTER — Non-Acute Institutional Stay (SKILLED_NURSING_FACILITY): Payer: Medicare Other | Admitting: Gerontology

## 2017-02-12 ENCOUNTER — Encounter: Payer: Self-pay | Admitting: Gerontology

## 2017-02-12 DIAGNOSIS — I959 Hypotension, unspecified: Secondary | ICD-10-CM | POA: Diagnosis not present

## 2017-02-12 DIAGNOSIS — S065X9A Traumatic subdural hemorrhage with loss of consciousness of unspecified duration, initial encounter: Secondary | ICD-10-CM

## 2017-02-12 DIAGNOSIS — S065XAA Traumatic subdural hemorrhage with loss of consciousness status unknown, initial encounter: Secondary | ICD-10-CM

## 2017-02-12 DIAGNOSIS — R634 Abnormal weight loss: Secondary | ICD-10-CM

## 2017-02-12 LAB — VITAMIN D 25 HYDROXY (VIT D DEFICIENCY, FRACTURES): Vit D, 25-Hydroxy: 18.1 ng/mL — ABNORMAL LOW (ref 30.0–100.0)

## 2017-02-12 NOTE — Progress Notes (Signed)
Location:   [facility] Room Number: [place] of Service:  SNF [phone]) Provider:  Toni Arthurs, NP-C  [place], Leonie Douglas, MD  Patient Care Team: Idelle Crouch, MD as PCP - General (Internal Medicine)  Extended Emergency Contact Information Primary Emergency Contact: Jamey Ripa [place] Phone: [phone] Relation: Daughter Secondary Emergency Contact: Gomes,Jimmy  [place] Phone: [phone] Relation: Son  Code Status:  FULL Goals of care: Advanced Directive information Advanced Directives 02/12/2017  Does Patient Have a Medical Advance Directive? No     Chief Complaint  Patient presents with  . Medical Management of Chronic Issues    Routine Visit    HPI:  Pt is a 84 y.o. male seen today for medical management of chronic diseases.  Patient was admitted to the facility for rehab following admission to [facility] with subsequent transfer to [facility] after a fall in the driveway, patient striking his head on the concrete.  Patient developed subdural hematoma that was treated conservatively at [facility].  Patient was transferred here for rehab when he was stable.  Patient has not been able to participate fully in PT and OT due to confusion and somnolence that seems to have been progressive.  Patient's appetite has been poor.  He typically only eats approximately 25% of each meal.  He is now on nectar thickened liquids with a regular diet.  Since admission on 01/17/2017, patient has lost 16 pounds.  Patient has been on Ensure twice daily for supplementation.  Patient's vitals had been stable until today.  He is having acute hypotension with increased somnolence.  Patient tends to stay awake all night and sleep more during the day.  During the day, he is typically in the wheelchair or [place] chair in the commons area.  He is thin and cachectic.  Prior to the fall, he lived independently at home.  However, patient  seems to be declining at this point.  Will order palliative medicine to consult for family discussions and discussion of CODE STATUS.  Please note pt with limited verbal ability. Unable to obtain complete ROS. Some ROS info obtained from staff and documentation.    Past Medical History:  Diagnosis Date  . Arteriosclerotic cardiovascular disease    status post MI with PTCA and stent placement 2012  . BPH (benign prostatic hyperplasia)   . CAD (coronary artery disease)   . COPD (chronic obstructive pulmonary disease) ([place])   . Elevated PSA   . Fracture of left femur ([place])   . GERD (gastroesophageal reflux disease)   . Heart attack ([place])   . Hematuria   . HLD (hyperlipidemia)   . Hyperlipidemia, unspecified   . Hypertension   . Osteoporosis   . Urinary retention    Past Surgical History:  Procedure Laterality Date  . APPENDECTOMY    . Arm surgery Right   . CARDIAC CATHETERIZATION    . carotid surgery stent    . CORONARY ANGIOPLASTY    . ELBOW SURGERY Right   . HERNIA REPAIR     right inguinal   . HIP SURGERY Left 2006   left hip replacement    Allergies  Allergen Reactions  . Eggs Or Egg-Derived Products Nausea And Vomiting    Allergies as of 02/12/2017      Reactions   Eggs Or Egg-derived Products Nausea And Vomiting      Medication List        Accurate as of  02/12/17 12:55 PM. Always use your most recent med list.          aspirin EC 81 MG tablet Take 81 mg by mouth daily.   atorvastatin 20 MG tablet Commonly known as:  LIPITOR Take 20 mg by mouth daily.   ENSURE Take 237 mLs by mouth 2 (two) times daily between meals.   finasteride 5 MG tablet Commonly known as:  PROSCAR Take 1 tablet (5 mg total) by mouth daily.   ketotifen 0.025 % ophthalmic solution Commonly known as:  ZADITOR Place 1 drop into both eyes every 12 (twelve) hours.   lactulose 10 GM/15ML solution Commonly known as:  CHRONULAC Take 10 g by mouth 2 (two) times daily.     metoprolol succinate 25 MG 24 hr tablet Commonly known as:  TOPROL-XL Take 12.5 mg by mouth 2 (two) times daily.   modafinil 100 MG tablet Commonly known as:  PROVIGIL Take 100 mg by mouth daily.   nitroGLYCERIN 0.4 MG SL tablet Commonly known as:  NITROSTAT Place 0.4 mg under the tongue every 5 (five) minutes as needed for chest pain.   SENNA S 8.6-50 MG tablet Generic drug:  senna-docusate Take 2 tablets by mouth 2 (two) times daily.       Review of Systems  Unable to perform ROS: Dementia  Constitutional: Positive for activity change, appetite change and fatigue. Negative for chills, diaphoresis and fever.  HENT: Negative for congestion, mouth sores, nosebleeds, postnasal drip, sneezing, sore throat, trouble swallowing and voice change.   Respiratory: Negative for apnea, cough, choking, chest tightness, shortness of breath and wheezing.   Cardiovascular: Negative for chest pain, palpitations and leg swelling.  Gastrointestinal: Negative for abdominal distention, abdominal pain, constipation, diarrhea and nausea.  Genitourinary: Negative for difficulty urinating, dysuria, frequency and urgency.  Musculoskeletal: Negative for arthralgias (typical arthritis), back pain, gait problem and myalgias.  Skin: Negative for color change, pallor, rash and wound.  Neurological: Positive for weakness. Negative for dizziness, tremors, syncope, speech difficulty, numbness and headaches.  Psychiatric/Behavioral: Negative for agitation and behavioral problems.  All other systems reviewed and are negative.    There is no immunization history on file for this patient. Pertinent  Health Maintenance Due  Topic Date Due  . PNA vac Low Risk Adult (1 of 2 - PCV13) 10/18/1990  . INFLUENZA VACCINE  08/21/2016   No flowsheet data found. Functional Status Survey:    Vitals:   02/12/17 1252  BP: 133/86  Pulse: 78  Resp: 20  Temp: 98.4 F (36.9 C)  TempSrc: Oral  SpO2: 96%  Weight: 98 lb  3.2 oz (44.5 kg)  Height: 5' 1"  (1.549 m)   Body mass index is 18.55 kg/m. Physical Exam  Constitutional: He is oriented to person, place, and time. He appears lethargic. He appears cachectic. He is active and cooperative. He appears ill. No distress.  HENT:  Head: Normocephalic and atraumatic.  Mouth/Throat: Uvula is midline, oropharynx is clear and moist and mucous membranes are normal. Mucous membranes are not pale, not dry and not cyanotic.  Eyes: Conjunctivae, EOM and lids are normal. Pupils are equal, round, and reactive to light.  Neck: Trachea normal, normal range of motion and full passive range of motion without pain. Neck supple. No JVD present. No tracheal deviation, no edema and no erythema present. No thyromegaly present.  Cardiovascular: Normal rate, regular rhythm, normal heart sounds, intact distal pulses and normal pulses. Exam reveals no gallop, no distant heart sounds and no friction  rub.  No murmur heard. Pulses:      Dorsalis pedis pulses are 2+ on the right side, and 2+ on the left side.  No edema  Pulmonary/Chest: Effort normal and breath sounds normal. No accessory muscle usage. No respiratory distress. He has no decreased breath sounds. He has no wheezes. He has no rhonchi. He has no rales. He exhibits no tenderness.  Abdominal: Soft. Normal appearance and bowel sounds are normal. He exhibits no distension and no ascites. There is no tenderness.  Musculoskeletal: Normal range of motion. He exhibits no edema or tenderness.  Expected osteoarthritis, stiffness; Bilateral Calves soft, supple. Negative Homan's Sign. B- pedal pulses equal; weakness  Neurological: He is oriented to person, place, and time. He has normal strength. He appears lethargic. He displays atrophy. A cranial nerve deficit and sensory deficit is present. He exhibits abnormal muscle tone. Coordination and gait abnormal.  Skin: Skin is warm, dry and intact. He is not diaphoretic. No cyanosis. No pallor.  Nails show no clubbing.  Psychiatric: He has a normal mood and affect. His speech is normal and behavior is normal. Thought content normal. Cognition and memory are impaired. He expresses impulsivity and inappropriate judgment. He exhibits abnormal recent memory and abnormal remote memory.  Nursing note and vitals reviewed.   Labs reviewed: Recent Labs    06/24/16 1946 01/14/17 2307 02/11/17 0445  NA 134* 134* 142  K 4.4 4.2 3.7  CL 99* 102 104  CO2 27 26 27   GLUCOSE 110* 115* 75  BUN 12 9 29*  CREATININE 1.21 1.13 1.04  CALCIUM 8.6* 8.4* 9.0  MG  --   --  1.9   Recent Labs    06/24/16 1946 02/11/17 0445  AST 26 29  ALT 12* 19  ALKPHOS 119 94  BILITOT 1.0 0.8  PROT 7.2 6.7  ALBUMIN 3.6 3.3*   Recent Labs    06/24/16 1946 01/14/17 2307 02/11/17 0445  WBC 10.3 8.9 9.4  NEUTROABS  --   --  6.5  HGB 14.3 13.6 13.2  HCT 42.2 40.5 39.8*  MCV 89.1 89.4 90.2  PLT 206 213 207   Lab Results  Component Value Date   TSH 2.214 02/11/2017   No results found for: HGBA1C No results found for: CHOL, HDL, LDLCALC, LDLDIRECT, TRIG, CHOLHDL  Significant Diagnostic Results in last 30 days:  Ct Head Wo Contrast  Result Date: 01/14/2017 CLINICAL DATA:  Status post fall, hitting left side of head on gravel. Concern for head or cervical spine injury. Patient unresponsive and disoriented. EXAM: CT HEAD WITHOUT CONTRAST CT CERVICAL SPINE WITHOUT CONTRAST TECHNIQUE: Multidetector CT imaging of the head and cervical spine was performed following the standard protocol without intravenous contrast. Multiplanar CT image reconstructions of the cervical spine were also generated. COMPARISON:  CT of the head performed 03/15/2014 FINDINGS: CT HEAD FINDINGS Brain: There is acute extra-axial hemorrhage overlying the left parietal lobe. This likely reflects a subdural hematoma, measuring up to 7 mm in thickness. Mild mass effect is seen, without significant midline shift. No overlying fracture is  identified. Prominence of the ventricles and sulci reflects mild to moderate cortical volume loss. Cerebellar atrophy is noted. Mild periventricular white matter change likely reflects small vessel ischemic microangiopathy. No evidence of acute infarction, hydrocephalus or mass lesion. The brainstem and fourth ventricle are within normal limits. The basal ganglia are unremarkable in appearance. The cerebral hemispheres demonstrate grossly normal gray-white differentiation. Vascular: No hyperdense vessel or unexpected calcification. Skull: There is no  evidence of fracture; visualized osseous structures are unremarkable in appearance. Sinuses/Orbits: The visualized portions of the orbits are within normal limits. The paranasal sinuses and mastoid air cells are well-aerated. Other: Soft tissue swelling is noted overlying the left frontoparietal calvarium. CT CERVICAL SPINE FINDINGS Alignment: Normal. Skull base and vertebrae: No acute fracture. No primary bone lesion or focal pathologic process. Soft tissues and spinal canal: No prevertebral fluid or swelling. No visible canal hematoma. Disc levels: Multilevel vacuum phenomenon is noted along the cervical spine, with anterior bridging osteophytes noted along the lower cervical and upper thoracic spine. Upper chest: There is scattered opacification of the bronchioles at the lung apices, with suggestion of slight enlargement. This could reflect a fungal or other atypical infection. Mild scarring is noted at the right lung apex, with associated calcification. The thyroid gland is unremarkable in appearance. Mild calcification is noted at the carotid bifurcations bilaterally. Other: No additional soft tissue abnormalities are seen. IMPRESSION: 1. Acute extra-axial hemorrhage overlying the left parietal lobe. This likely reflects a subdural hematoma, measuring up to 7 mm in thickness. Mild mass effect, without significant midline shift. Follow-up imaging is warranted to  ensure stability and resolution. 2. No evidence of fracture or subluxation along the cervical spine. 3. Soft tissue swelling overlying the left frontoparietal calvarium. 4. Scattered opacification of the bronchioles at the lung apices, with suggestion of slight bronchiolar enlargement. This could reflect a fungal or other atypical infection. Would correlate with the patient's symptoms. 5. Mild to moderate cortical volume loss and scattered small vessel ischemic microangiopathy. 6. Mild degenerative change along the cervical spine. 7. Mild calcification at the carotid bifurcations bilaterally. Critical Value/emergent results were called by telephone at the time of interpretation on 01/14/2017 at 11:52 pm to Dr. Hinda Kehr, who verbally acknowledged these results. Electronically Signed   By: Garald Balding M.D.   On: 01/14/2017 23:55   Ct Cervical Spine Wo Contrast  Result Date: 01/14/2017 CLINICAL DATA:  Status post fall, hitting left side of head on gravel. Concern for head or cervical spine injury. Patient unresponsive and disoriented. EXAM: CT HEAD WITHOUT CONTRAST CT CERVICAL SPINE WITHOUT CONTRAST TECHNIQUE: Multidetector CT imaging of the head and cervical spine was performed following the standard protocol without intravenous contrast. Multiplanar CT image reconstructions of the cervical spine were also generated. COMPARISON:  CT of the head performed 03/15/2014 FINDINGS: CT HEAD FINDINGS Brain: There is acute extra-axial hemorrhage overlying the left parietal lobe. This likely reflects a subdural hematoma, measuring up to 7 mm in thickness. Mild mass effect is seen, without significant midline shift. No overlying fracture is identified. Prominence of the ventricles and sulci reflects mild to moderate cortical volume loss. Cerebellar atrophy is noted. Mild periventricular white matter change likely reflects small vessel ischemic microangiopathy. No evidence of acute infarction, hydrocephalus or mass  lesion. The brainstem and fourth ventricle are within normal limits. The basal ganglia are unremarkable in appearance. The cerebral hemispheres demonstrate grossly normal gray-white differentiation. Vascular: No hyperdense vessel or unexpected calcification. Skull: There is no evidence of fracture; visualized osseous structures are unremarkable in appearance. Sinuses/Orbits: The visualized portions of the orbits are within normal limits. The paranasal sinuses and mastoid air cells are well-aerated. Other: Soft tissue swelling is noted overlying the left frontoparietal calvarium. CT CERVICAL SPINE FINDINGS Alignment: Normal. Skull base and vertebrae: No acute fracture. No primary bone lesion or focal pathologic process. Soft tissues and spinal canal: No prevertebral fluid or swelling. No visible canal hematoma. Disc levels: Multilevel  vacuum phenomenon is noted along the cervical spine, with anterior bridging osteophytes noted along the lower cervical and upper thoracic spine. Upper chest: There is scattered opacification of the bronchioles at the lung apices, with suggestion of slight enlargement. This could reflect a fungal or other atypical infection. Mild scarring is noted at the right lung apex, with associated calcification. The thyroid gland is unremarkable in appearance. Mild calcification is noted at the carotid bifurcations bilaterally. Other: No additional soft tissue abnormalities are seen. IMPRESSION: 1. Acute extra-axial hemorrhage overlying the left parietal lobe. This likely reflects a subdural hematoma, measuring up to 7 mm in thickness. Mild mass effect, without significant midline shift. Follow-up imaging is warranted to ensure stability and resolution. 2. No evidence of fracture or subluxation along the cervical spine. 3. Soft tissue swelling overlying the left frontoparietal calvarium. 4. Scattered opacification of the bronchioles at the lung apices, with suggestion of slight bronchiolar  enlargement. This could reflect a fungal or other atypical infection. Would correlate with the patient's symptoms. 5. Mild to moderate cortical volume loss and scattered small vessel ischemic microangiopathy. 6. Mild degenerative change along the cervical spine. 7. Mild calcification at the carotid bifurcations bilaterally. Critical Value/emergent results were called by telephone at the time of interpretation on 01/14/2017 at 11:52 pm to Dr. Hinda Kehr, who verbally acknowledged these results. Electronically Signed   By: Garald Balding M.D.   On: 01/14/2017 23:55    Assessment/Plan Subdural hematoma (HCC)  Continue PT/OT  Continue exercises as taught by PT/OT as tolerated  Monitor closely for pain  Fall risk  Monitor closely for safety  Follow-up with neurology as instructed  Abnormal weight loss  Continue Ensure 1 bottle p.o. twice daily between meals  Pro-stat 30 mL p.o. twice daily between meals  Mirtazapine 7.5 mg p.o. nightly for appetite stimulation   Assist with meals and meal set up as needed  Palliative medicine consult  Hypotension, unspecified hypotension type  Insert and maintain peripheral IV for infusion of fluids  0.9% sodium chloride-250 mL bolus over 1 hour, then continuous at 50 mL/hour times 48 hours for hydration  Recheck labs Friday   Family/ staff Communication:   Total Time:  Documentation:  Face to Face:  Family/Phone:   Labs/tests ordered: CBC, met C on 1/25  Medication list reviewed and assessed for continued appropriateness. Monthly medication orders reviewed and signed.  Vikki Ports, NP-C Geriatrics [facility] [phone], [place] [address] Cell Phone (Mon-Fri 8am-5pm):  [phone] On Call:  [phone] & follow prompts after 5pm & weekends Office Phone:  [phone] Office Fax:  [phone]

## 2017-02-15 ENCOUNTER — Emergency Department
Admission: EM | Admit: 2017-02-15 | Discharge: 2017-02-15 | Disposition: A | Discharge status: Other Acute Inpatient Hospital | Payer: Medicare Other | Attending: Emergency Medicine | Admitting: Emergency Medicine

## 2017-02-15 ENCOUNTER — Emergency Department: Payer: Medicare Other

## 2017-02-15 ENCOUNTER — Encounter: Payer: Self-pay | Admitting: Emergency Medicine

## 2017-02-15 ENCOUNTER — Other Ambulatory Visit: Payer: Self-pay

## 2017-02-15 DIAGNOSIS — I2510 Atherosclerotic heart disease of native coronary artery without angina pectoris: Secondary | ICD-10-CM | POA: Diagnosis not present

## 2017-02-15 DIAGNOSIS — F17290 Nicotine dependence, other tobacco product, uncomplicated: Secondary | ICD-10-CM | POA: Diagnosis not present

## 2017-02-15 DIAGNOSIS — Y9389 Activity, other specified: Secondary | ICD-10-CM | POA: Insufficient documentation

## 2017-02-15 DIAGNOSIS — G309 Alzheimer's disease, unspecified: Secondary | ICD-10-CM | POA: Insufficient documentation

## 2017-02-15 DIAGNOSIS — Z96642 Presence of left artificial hip joint: Secondary | ICD-10-CM | POA: Insufficient documentation

## 2017-02-15 DIAGNOSIS — Y92129 Unspecified place in nursing home as the place of occurrence of the external cause: Secondary | ICD-10-CM | POA: Diagnosis not present

## 2017-02-15 DIAGNOSIS — W0110XA Fall on same level from slipping, tripping and stumbling with subsequent striking against unspecified object, initial encounter: Secondary | ICD-10-CM | POA: Insufficient documentation

## 2017-02-15 DIAGNOSIS — J449 Chronic obstructive pulmonary disease, unspecified: Secondary | ICD-10-CM | POA: Diagnosis not present

## 2017-02-15 DIAGNOSIS — F0280 Dementia in other diseases classified elsewhere without behavioral disturbance: Secondary | ICD-10-CM | POA: Diagnosis not present

## 2017-02-15 DIAGNOSIS — Y998 Other external cause status: Secondary | ICD-10-CM | POA: Diagnosis not present

## 2017-02-15 DIAGNOSIS — S065X0A Traumatic subdural hemorrhage without loss of consciousness, initial encounter: Secondary | ICD-10-CM | POA: Diagnosis not present

## 2017-02-15 DIAGNOSIS — Z7982 Long term (current) use of aspirin: Secondary | ICD-10-CM | POA: Diagnosis not present

## 2017-02-15 DIAGNOSIS — Z79899 Other long term (current) drug therapy: Secondary | ICD-10-CM | POA: Diagnosis not present

## 2017-02-15 DIAGNOSIS — S065XAA Traumatic subdural hemorrhage with loss of consciousness status unknown, initial encounter: Secondary | ICD-10-CM

## 2017-02-15 DIAGNOSIS — S065X9A Traumatic subdural hemorrhage with loss of consciousness of unspecified duration, initial encounter: Secondary | ICD-10-CM

## 2017-02-15 DIAGNOSIS — I10 Essential (primary) hypertension: Secondary | ICD-10-CM | POA: Insufficient documentation

## 2017-02-15 DIAGNOSIS — S0990XA Unspecified injury of head, initial encounter: Secondary | ICD-10-CM | POA: Diagnosis present

## 2017-02-15 LAB — CBC
HCT: 47.1 % (ref 40.0–52.0)
HEMOGLOBIN: 15.7 g/dL (ref 13.0–18.0)
MCH: 29.8 pg (ref 26.0–34.0)
MCHC: 33.3 g/dL (ref 32.0–36.0)
MCV: 89.5 fL (ref 80.0–100.0)
Platelets: 205 10*3/uL (ref 150–440)
RBC: 5.26 MIL/uL (ref 4.40–5.90)
RDW: 13.7 % (ref 11.5–14.5)
WBC: 8.8 10*3/uL (ref 3.8–10.6)

## 2017-02-15 LAB — COMPREHENSIVE METABOLIC PANEL
ALBUMIN: 4.1 g/dL (ref 3.5–5.0)
ALK PHOS: 115 U/L (ref 38–126)
ALT: 21 U/L (ref 17–63)
AST: 36 U/L (ref 15–41)
Anion gap: 9 (ref 5–15)
BUN: 36 mg/dL — ABNORMAL HIGH (ref 6–20)
CALCIUM: 9.6 mg/dL (ref 8.9–10.3)
CO2: 29 mmol/L (ref 22–32)
CREATININE: 0.97 mg/dL (ref 0.61–1.24)
Chloride: 104 mmol/L (ref 101–111)
GFR calc Af Amer: >60 mL/min (ref >60)
GFR calc non Af Amer: >60 mL/min (ref >60)
GLUCOSE: 119 mg/dL — AB (ref 65–99)
Potassium: 4.7 mmol/L (ref 3.5–5.1)
SODIUM: 142 mmol/L (ref 135–145)
Total Bilirubin: 1.1 mg/dL (ref 0.3–1.2)
Total Protein: 7.9 g/dL (ref 6.5–8.1)

## 2017-02-15 LAB — PROTIME-INR
INR: 0.95
PROTHROMBIN TIME: 12.6 s (ref 11.4–15.2)

## 2017-02-15 LAB — APTT: aPTT: 31 seconds (ref 24–36)

## 2017-02-15 NOTE — ED Notes (Signed)
[facility] on scene for transport 

## 2017-02-15 NOTE — ED Provider Notes (Signed)
[facility] Emergency Department Provider Note   ____________________________________________    I have reviewed the triage vital signs and the nursing notes.   HISTORY  Chief Complaint Fall   History limited by dementia  HPI Eric [name] is a 84 y.o. male who presents after a fall.  Patient reportedly got up out of a recliner while the staff was answering a call bell at nursing facility and walked through a door and a visitor found him lying on the ground on his left side.  Family describes recent fall with subdural hematoma on Christmas followed by 3 days stay at [facility], no surgical intervention performed.  Have been following up with [facility] neurosurgery, have CT scan scheduled for Monday.  Patient complains of some pain in his left hip but otherwise does not seem to complain of any acute issues.  Family reports his dementia has become much worse since the initial head injury in December.  [facility] medical records reviewed   Past Medical History:  Diagnosis Date  . Arteriosclerotic cardiovascular disease    status post MI with PTCA and stent placement 2012  . BPH (benign prostatic hyperplasia)   . CAD (coronary artery disease)   . COPD (chronic obstructive pulmonary disease) (HCC)   . Elevated PSA   . Fracture of left femur (HCC)   . GERD (gastroesophageal reflux disease)   . Heart attack (HCC)   . Hematuria   . HLD (hyperlipidemia)   . Hyperlipidemia, unspecified   . Hypertension   . Osteoporosis   . Urinary retention     Patient Active Problem List   Diagnosis Date Noted  . Abnormal weight loss 02/12/2017  . Hypotension 02/12/2017  . Subdural hematoma (HCC) 02/10/2017  . Alzheimer's disease with late onset 02/10/2017  . History of urinary retention 01/10/2015  . BPH with obstruction/lower urinary tract symptoms 01/10/2015  . Coronary artery disease 01/07/2011  . Dyslipidemia 01/07/2011  . S/P hip replacement 01/07/2011     Past Surgical History:  Procedure Laterality Date  . APPENDECTOMY    . Arm surgery Right   . CARDIAC CATHETERIZATION    . carotid surgery stent    . CORONARY ANGIOPLASTY    . ELBOW SURGERY Right   . HERNIA REPAIR     right inguinal   . HIP SURGERY Left 2006   left hip replacement    Prior to Admission medications   Medication Sig Start Date End Date Taking? Authorizing Provider  aspirin EC 81 MG tablet Take 81 mg by mouth daily.     [provider]  atorvastatin (LIPITOR) 20 MG tablet Take 20 mg by mouth daily.  08/08/14   [provider]  ENSURE (ENSURE) Take 237 mLs by mouth 2 (two) times daily between meals.    [provider]  finasteride (PROSCAR) 5 MG tablet Take 1 tablet (5 mg total) by mouth daily. 01/09/17   Michiel Cowboy A, PA-C  ketotifen (ZADITOR) 0.025 % ophthalmic solution Place 1 drop into both eyes every 12 (twelve) hours.    [provider]  lactulose (CHRONULAC) 10 GM/15ML solution Take 10 g by mouth 2 (two) times daily.    [provider]  metoprolol succinate (TOPROL-XL) 25 MG 24 hr tablet Take 12.5 mg by mouth 2 (two) times daily.     [provider]  modafinil (PROVIGIL) 100 MG tablet Take 100 mg by mouth daily.    [provider]  nitroGLYCERIN (NITROSTAT) 0.4 MG  SL tablet Place 0.4 mg under the tongue every 5 (five) minutes as needed for chest pain.     [provider]  senna-docusate (SENNA S) 8.6-50 MG tablet Take 2 tablets by mouth 2 (two) times daily.    [provider]     Allergies Eggs or egg-derived products  Family History  Problem Relation Age of Onset  . Stroke Mother   . Arthritis Mother   . Heart attack Father   . Diabetes Father   . Stroke Brother   . Prostate cancer Neg Hx   . Bladder Cancer Neg Hx     Social History Social History   Tobacco Use  . Smoking status: Current Every Day Smoker    Types: Pipe  . Smokeless tobacco: Never Used   Substance Use Topics  . Alcohol use: No    Alcohol/week: 0.0 oz  . Drug use: No    5 caveat: Unable to obtain full review of Systems due to dementia    ENT: No neck pain Cardiovascular: Denies chest pain. Respiratory: Denies shortness of breath. Gastrointestinal: No abdominal pain.  .    Musculoskeletal: Left hip pain  Neurological: Negative for headaches    ____________________________________________   PHYSICAL EXAM:  VITAL SIGNS: ED Triage Vitals  Enc Vitals Group     BP 02/15/17 1726 (!) 145/99     Pulse --      Resp 02/15/17 1726 16     Temp 02/15/17 1730 98.1 F (36.7 C)     Temp Source 02/15/17 1730 Rectal     SpO2 02/15/17 1730 100 %     Weight 02/15/17 1728 44.5 kg (98 lb)     Height 02/15/17 1728 1.549 m (5\' 1" )     Head Circumference --      Peak Flow --      Pain Score --      Pain Loc --      Pain Edu? --      Excl. in GC? --     Constitutional: Alert. No acute distress.  Able to answer simple questions Eyes: Conjunctivae are normal.  PERRLA Head: Atraumatic.  No hematoma or bruising or swelling Nose: No congestion/rhinnorhea. Mouth/Throat: Mucous membranes are dry Neck:  Painless ROM, no vertebral tenderness to palpation Cardiovascular: Normal rate, regular rhythm.   Good peripheral circulation. Respiratory: Normal respiratory effort.  No retractions. Lungs CTAB. Gastrointestinal: Soft and nontender. No distention.   Musculoskeletal: Patient with mild pain left hip with axial load.  Extremities otherwise warm and well perfused with painless range of motion Neurologic:  No gross focal neurologic deficits are appreciated.  Patient moves all extremities equally and strength appears normal throughout.  Cranial nerves II through XII are normal as best as I can tell Skin:  Skin is warm, dry and intact. No rash noted. Psychiatric: Mood and affect are normal. Speech and behavior are normal.  ____________________________________________    LABS (all labs ordered are listed, but only abnormal results are displayed)  Labs Reviewed  COMPREHENSIVE METABOLIC PANEL - Abnormal; Notable for the following components:      Result Value   Glucose, Bld 119 (*)    BUN 36 (*)    All other components within normal limits  APTT  PROTIME-INR  CBC   ____________________________________________  EKG  None ____________________________________________  RADIOLOGY  CT head demonstrates subdural hematomas, called by radiology ____________________________________________   PROCEDURES  Procedure(s) performed: No  Procedures   Critical Care performed: yes  CRITICAL CARE  Performed by: Jene Everyobert Hollin Crewe   Total critical care time:40 minutes  Critical care time was exclusive of separately billable procedures and treating other patients.  Critical care was necessary to treat or prevent imminent or life-threatening deterioration.  Critical care was time spent personally by me on the following activities: development of treatment plan with patient and/or surrogate as well as nursing, discussions with consultants, evaluation of patient's response to treatment, examination of patient, obtaining history from patient or surrogate, ordering and performing treatments and interventions, ordering and review of laboratory studies, ordering and review of radiographic studies, pulse oximetry and re-evaluation of patient's condition.  ____________________________________________   INITIAL IMPRESSION / ASSESSMENT AND PLAN / ED COURSE  Pertinent labs & imaging results that were available during my care of the patient were reviewed by me and considered in my medical decision making (see chart for details).  Patient presents after unwitnessed fall.  History of subdural hematoma.  Given this we will obtain CT head although no clear evidence of head trauma on exam.  Will also x-ray left hip and pelvis, otherwise exam is reassuring.  Contacted by  radiologist and notified of acute on subacute left subdural and subacute right subdural with left to right midline shift  ----------------------------------------- 7:02 PM on 02/15/2017 -----------------------------------------  [facility]  Spoke to Dr. Adriana Simasook of [facility] neurosurgery, he states nonoperative candidate and to discuss with internal medicine for observation    ____________________________________________   FINAL CLINICAL IMPRESSION(S) / ED DIAGNOSES  Final diagnoses:  Subdural hematoma (HCC)        Note:  This document was prepared using Dragon voice recognition software and may include unintentional dictation errors.    Jene EveryKinner, Jedrick Hutcherson, MD 02/15/17 2010

## 2017-02-15 NOTE — ED Triage Notes (Signed)
[facility]. Per EMS pt was in wheelchair at the nurses station when the nurses stepped away to help another patient, EMS reports per the nurses that patient managed to wheel himself outside and slide out of his wheelchiar. EMS reports pt c/o pain all over, has hx of falls with a head bleed and patient is at current facility for rehab. Per EMS pt estimated to have been outside for less than 2 minutes before being found on the ground.

## 2017-02-17 MED ORDER — ATORVASTATIN CALCIUM 10 MG PO TABS
20.00 | ORAL_TABLET | ORAL | Status: DC
Start: 2017-02-19 — End: 2017-02-17

## 2017-02-17 MED ORDER — ONDANSETRON HCL 4 MG/2ML IJ SOLN
4.00 | INTRAMUSCULAR | Status: DC
Start: ? — End: 2017-02-17

## 2017-02-17 MED ORDER — SENNOSIDES-DOCUSATE SODIUM 8.6-50 MG PO TABS
2.00 | ORAL_TABLET | ORAL | Status: DC
Start: ? — End: 2017-02-17

## 2017-02-17 MED ORDER — LABETALOL HCL 5 MG/ML IV SOLN
10.00 | INTRAVENOUS | Status: DC
Start: ? — End: 2017-02-17

## 2017-02-17 MED ORDER — HYDRALAZINE HCL 20 MG/ML IJ SOLN
10.00 | INTRAMUSCULAR | Status: DC
Start: ? — End: 2017-02-17

## 2017-02-17 MED ORDER — LIDOCAINE HCL 1 % IJ SOLN
.50 | INTRAMUSCULAR | Status: DC
Start: ? — End: 2017-02-17

## 2017-02-17 MED ORDER — POLYETHYLENE GLYCOL 3350 17 G PO PACK
17.00 | PACK | ORAL | Status: DC
Start: ? — End: 2017-02-17

## 2017-02-17 MED ORDER — ACETAMINOPHEN 325 MG PO TABS
650.00 | ORAL_TABLET | ORAL | Status: DC
Start: ? — End: 2017-02-17

## 2017-02-17 MED ORDER — FINASTERIDE 5 MG PO TABS
5.00 | ORAL_TABLET | ORAL | Status: DC
Start: 2017-02-19 — End: 2017-02-17

## 2017-02-17 MED ORDER — MODAFINIL 100 MG PO TABS
100.00 | ORAL_TABLET | ORAL | Status: DC
Start: 2017-02-19 — End: 2017-02-17

## 2017-02-17 MED ORDER — SODIUM CHLORIDE 0.9 % IJ SOLN
5.00 | INTRAMUSCULAR | Status: DC
Start: 2017-02-18 — End: 2017-02-17

## 2017-02-17 MED ORDER — SODIUM CHLORIDE 0.9 % IV SOLN
INTRAVENOUS | Status: DC
Start: ? — End: 2017-02-17

## 2017-02-19 ENCOUNTER — Emergency Department: Payer: Medicare Other

## 2017-02-19 ENCOUNTER — Encounter: Payer: Self-pay | Admitting: Emergency Medicine

## 2017-02-19 ENCOUNTER — Other Ambulatory Visit: Payer: Self-pay

## 2017-02-19 ENCOUNTER — Emergency Department
Admission: EM | Admit: 2017-02-19 | Discharge: 2017-02-19 | Disposition: A | Discharge status: Home / Self Care | Payer: Medicare Other | Attending: Emergency Medicine | Admitting: Emergency Medicine

## 2017-02-19 DIAGNOSIS — Y9210 Unspecified residential institution as the place of occurrence of the external cause: Secondary | ICD-10-CM | POA: Diagnosis not present

## 2017-02-19 DIAGNOSIS — M25552 Pain in left hip: Secondary | ICD-10-CM | POA: Diagnosis present

## 2017-02-19 DIAGNOSIS — S065X9A Traumatic subdural hemorrhage with loss of consciousness of unspecified duration, initial encounter: Secondary | ICD-10-CM | POA: Diagnosis not present

## 2017-02-19 DIAGNOSIS — G309 Alzheimer's disease, unspecified: Secondary | ICD-10-CM | POA: Diagnosis not present

## 2017-02-19 DIAGNOSIS — I2510 Atherosclerotic heart disease of native coronary artery without angina pectoris: Secondary | ICD-10-CM | POA: Diagnosis not present

## 2017-02-19 DIAGNOSIS — Z79899 Other long term (current) drug therapy: Secondary | ICD-10-CM | POA: Diagnosis not present

## 2017-02-19 DIAGNOSIS — S065XAA Traumatic subdural hemorrhage with loss of consciousness status unknown, initial encounter: Secondary | ICD-10-CM

## 2017-02-19 DIAGNOSIS — J449 Chronic obstructive pulmonary disease, unspecified: Secondary | ICD-10-CM | POA: Diagnosis not present

## 2017-02-19 DIAGNOSIS — I10 Essential (primary) hypertension: Secondary | ICD-10-CM | POA: Diagnosis not present

## 2017-02-19 DIAGNOSIS — W19XXXA Unspecified fall, initial encounter: Secondary | ICD-10-CM | POA: Insufficient documentation

## 2017-02-19 DIAGNOSIS — Y999 Unspecified external cause status: Secondary | ICD-10-CM | POA: Diagnosis not present

## 2017-02-19 DIAGNOSIS — Y939 Activity, unspecified: Secondary | ICD-10-CM | POA: Insufficient documentation

## 2017-02-19 DIAGNOSIS — Z96642 Presence of left artificial hip joint: Secondary | ICD-10-CM | POA: Diagnosis not present

## 2017-02-19 DIAGNOSIS — F17290 Nicotine dependence, other tobacco product, uncomplicated: Secondary | ICD-10-CM | POA: Insufficient documentation

## 2017-02-19 MED ORDER — MODAFINIL 100 MG PO TABS: 100.0000 mg | ORAL_TABLET | Freq: Every day | ORAL | 0 refills | Status: DC

## 2017-02-19 NOTE — ED Provider Notes (Signed)
[facility] Emergency Department Provider Note  ____________________________________________   First MD Initiated Contact with Patient 02/19/17 [phone]     (approximate)  I have reviewed the triage vital signs and the nursing notes.   HISTORY  Chief Complaint Fall   HPI Eric [name] is a 84 y.o. male with a recent history of intracranial hemorrhage x2, [facility] and just discharged back to rehab last night who is presenting after a another fall.  The fall was unwitnessed.  He is here with his daughter and POA who says that it appears that he was left alone for brief period of time this morning and got up to go to the bathroom.  She says that he had a bowel movement in the trash can in his bathroom and was found on the floor.  He was found complaining of back pain as well as left hip pain.  Daughter says that he is acting at his baseline mental status right now.  She says that he had [facility] and they have opted out of surgery because the cost outweighs the benefit at this point.  The patient suffers from dementia and while he knows his name usually is not otherwise oriented to any other questions.  Patient without any complaints at this time.  Past Medical History:  Diagnosis Date  . Arteriosclerotic cardiovascular disease    status post MI with PTCA and stent placement 2012  . BPH (benign prostatic hyperplasia)   . CAD (coronary artery disease)   . COPD (chronic obstructive pulmonary disease) (HCC)   . Elevated PSA   . Fracture of left femur (HCC)   . GERD (gastroesophageal reflux disease)   . Heart attack (HCC)   . Hematuria   . HLD (hyperlipidemia)   . Hyperlipidemia, unspecified   . Hypertension   . Osteoporosis   . Urinary retention     Patient Active Problem List   Diagnosis Date Noted  . Abnormal weight loss 02/12/2017  . Hypotension 02/12/2017  . Subdural hematoma (HCC) 02/10/2017  . Alzheimer's disease  with late onset 02/10/2017  . History of urinary retention 01/10/2015  . BPH with obstruction/lower urinary tract symptoms 01/10/2015  . Coronary artery disease 01/07/2011  . Dyslipidemia 01/07/2011  . S/P hip replacement 01/07/2011    Past Surgical History:  Procedure Laterality Date  . APPENDECTOMY    . Arm surgery Right   . CARDIAC CATHETERIZATION    . carotid surgery stent    . CORONARY ANGIOPLASTY    . ELBOW SURGERY Right   . HERNIA REPAIR     right inguinal   . HIP SURGERY Left 2006   left hip replacement    Prior to Admission medications   Medication Sig Start Date End Date Taking? Authorizing Provider  acetaminophen (TYLENOL) 325 MG tablet Take 975 mg by mouth every 6 (six) hours as needed.   Yes [provider]  atorvastatin (LIPITOR) 20 MG tablet Take 20 mg by mouth daily.  08/08/14  Yes [provider]  ENSURE (ENSURE) Take 237 mLs by mouth 2 (two) times daily between meals.   Yes [provider]  finasteride (PROSCAR) 5 MG tablet Take 1 tablet (5 mg total) by mouth daily. 01/09/17  Yes McGowan, Carollee HerterShannon A, PA-C  metoprolol succinate (TOPROL-XL) 25 MG 24 hr tablet Take 12.5 mg by mouth 2 (two) times daily.    Yes [provider]  modafinil (PROVIGIL) 100 MG tablet Take 100  mg by mouth daily.   Yes [provider]  polyethylene glycol (MIRALAX / GLYCOLAX) packet Take 17 g by mouth daily.   Yes [provider]  senna-docusate (SENNA S) 8.6-50 MG tablet Take 2 tablets by mouth 2 (two) times daily.   Yes [provider]  nitroGLYCERIN (NITROSTAT) 0.4 MG SL tablet Place 0.4 mg under the tongue every 5 (five) minutes as needed for chest pain.     [provider]    Allergies Eggs or egg-derived products  Family History  Problem Relation Age of Onset  . Stroke Mother   . Arthritis Mother   . Heart attack Father   . Diabetes Father   . Stroke Brother   . Prostate cancer Neg Hx   . Bladder Cancer Neg  Hx     Social History Social History   Tobacco Use  . Smoking status: Current Every Day Smoker    Types: Pipe  . Smokeless tobacco: Never Used  Substance Use Topics  . Alcohol use: No    Alcohol/week: 0.0 oz  . Drug use: No    Review of Systems  Level 5 caveat secondary to dementia  ____________________________________________   PHYSICAL EXAM:  VITAL SIGNS: ED Triage Vitals  Enc Vitals Group     BP 02/19/17 0922 (!) 142/104     Pulse Rate 02/19/17 0922 76     Resp 02/19/17 0922 16     Temp 02/19/17 0922 97.6 F (36.4 C)     Temp Source 02/19/17 0922 Oral     SpO2 02/19/17 0922 99 %     Weight 02/19/17 0924 98 lb (44.5 kg)     Height 02/19/17 0924 5\' 1"  (1.549 m)     Head Circumference --      Peak Flow --      Pain Score --      Pain Loc --      Pain Edu? --      Excl. in GC? --     Constitutional: Alert and oriented. Well appearing and in no acute distress. Eyes: Conjunctivae are normal.  Head: Atraumatic. Nose: No congestion/rhinnorhea. Mouth/Throat: Mucous membranes are moist.  Neck: No stridor.  No tenderness to palpation.  No deformity or step-off.   Cardiovascular: Normal rate, regular rhythm. Grossly normal heart sounds.  Good peripheral circulation. Respiratory: Normal respiratory effort.  No retractions. Lungs CTAB. Gastrointestinal: Soft and nontender. No distention.  Musculoskeletal: No lower extremity tenderness nor edema.  No joint effusions. Neurologic:  Normal speech and language. No gross focal neurologic deficits are appreciated.  5 out of 5 strength bilateral lower extremities.  No limb shortening.  No tenderness to the bilateral hips or pelvis. Skin:  Skin is warm, dry and intact. No rash noted. Psychiatric: Mood and affect are normal. Speech and behavior are normal.  ____________________________________________   LABS (all labs ordered are listed, but only abnormal results are displayed)  Labs Reviewed - No data to  display ____________________________________________  EKG   ____________________________________________  RADIOLOGY  No acute finding on the CT.  Stable hematomas without any acute findings. No acute finding on the x-ray of the pelvis nor the lumbar spine. ____________________________________________   PROCEDURES  Procedure(s) performed:   Procedures  Critical Care performed:   ____________________________________________   INITIAL IMPRESSION / ASSESSMENT AND PLAN / ED COURSE  Pertinent labs & imaging results that were available during my care of the patient were reviewed by me and considered in my medical decision making (see  chart for details).  DDX: Hip fracture, lumbar spine fracture, intracranial hemorrhage, skull fracture, fall As part of my medical decision making, I reviewed the following data within the electronic MEDICAL RECORD NUMBER Notes from prior ED visits  I had a discussion with the patient's daughter regarding workup.  The patient is mostly comfort care at this point per the daughter and she says that she would like to know as an FYI if he has had any progression of his bleeding internally to his brain.  However, she would not like any blood work or urine testing at this time.  We will also obtained hip x-ray as well as lumbar spine x-ray.   ----------------------------------------- 11:53 AM on 02/19/2017 -----------------------------------------  Patient at baseline mental status per daughter at the bedside.  We discussed the stable findings on the imaging studies.  Patient will be discharged at this time.  ____________________________________________   FINAL CLINICAL IMPRESSION(S) / ED DIAGNOSES  Fall.  Subdural hematoma.    NEW MEDICATIONS STARTED DURING THIS VISIT:  New Prescriptions   No medications on file     Note:  This document was prepared using Dragon voice recognition software and may include unintentional dictation errors.      Myrna Blazer, MD 02/19/17 1153

## 2017-02-19 NOTE — ED Triage Notes (Signed)
[facility] with a unwitnessed fall. He was found on the floor on his back. When getting him back to the bed they noticed that his back and left hip were bothering him.

## 2017-02-19 NOTE — ED Notes (Addendum)
[facility] Pt wearing his glasses and dentures. Clothes given to daughter who took them with her.

## 2017-02-19 NOTE — ED Notes (Signed)
Ems  Called  For  transport

## 2017-02-19 NOTE — ED Notes (Signed)
Patient transported to CT 

## 2017-02-19 NOTE — Telephone Encounter (Signed)
Rx sent to [facility] phone : [phone] , fax : [phone]  

## 2017-02-19 NOTE — ED Notes (Signed)
Patient transported to X-ray 

## 2017-02-19 NOTE — ED Notes (Signed)
Attempted to [facility], phone was busy. [facility], daughter to meet them there.

## 2017-02-21 ENCOUNTER — Encounter
Admission: RE | Admit: 2017-02-21 | Discharge: 2017-02-21 | Disposition: A | Discharge status: Home / Self Care | Payer: Medicare Other | Source: Ambulatory Visit | Attending: Internal Medicine | Admitting: Internal Medicine

## 2017-02-27 ENCOUNTER — Inpatient Hospital Stay: Admit: 2017-02-27 | Payer: Self-pay

## 2017-02-27 ENCOUNTER — Other Ambulatory Visit
Admission: RE | Admit: 2017-02-27 | Discharge: 2017-02-27 | Disposition: A | Discharge status: Home / Self Care | Payer: Medicare Other | Source: Ambulatory Visit | Attending: Gerontology | Admitting: Gerontology

## 2017-02-27 ENCOUNTER — Encounter: Payer: Self-pay | Admitting: Gerontology

## 2017-02-27 ENCOUNTER — Non-Acute Institutional Stay (SKILLED_NURSING_FACILITY): Payer: Medicare Other | Admitting: Gerontology

## 2017-02-27 DIAGNOSIS — X58XXXD Exposure to other specified factors, subsequent encounter: Secondary | ICD-10-CM | POA: Insufficient documentation

## 2017-02-27 DIAGNOSIS — R634 Abnormal weight loss: Secondary | ICD-10-CM | POA: Diagnosis not present

## 2017-02-27 DIAGNOSIS — S065X9A Traumatic subdural hemorrhage with loss of consciousness of unspecified duration, initial encounter: Secondary | ICD-10-CM | POA: Diagnosis not present

## 2017-02-27 DIAGNOSIS — G301 Alzheimer's disease with late onset: Secondary | ICD-10-CM | POA: Diagnosis not present

## 2017-02-27 DIAGNOSIS — S065X0D Traumatic subdural hemorrhage without loss of consciousness, subsequent encounter: Secondary | ICD-10-CM | POA: Insufficient documentation

## 2017-02-27 DIAGNOSIS — S065XAA Traumatic subdural hemorrhage with loss of consciousness status unknown, initial encounter: Secondary | ICD-10-CM

## 2017-02-27 DIAGNOSIS — F0280 Dementia in other diseases classified elsewhere without behavioral disturbance: Secondary | ICD-10-CM

## 2017-02-27 LAB — CBC WITH DIFFERENTIAL/PLATELET
Basophils Absolute: 0 10*3/uL (ref 0–0.1)
Basophils Relative: 1 %
EOS ABS: 0.4 10*3/uL (ref 0–0.7)
EOS PCT: 6 %
HCT: 42 % (ref 40.0–52.0)
HEMOGLOBIN: 13.6 g/dL (ref 13.0–18.0)
LYMPHS ABS: 1.4 10*3/uL (ref 1.0–3.6)
Lymphocytes Relative: 22 %
MCH: 29.5 pg (ref 26.0–34.0)
MCHC: 32.5 g/dL (ref 32.0–36.0)
MCV: 90.7 fL (ref 80.0–100.0)
MONO ABS: 0.5 10*3/uL (ref 0.2–1.0)
MONOS PCT: 9 %
Neutro Abs: 3.9 10*3/uL (ref 1.4–6.5)
Neutrophils Relative %: 62 %
PLATELETS: 213 10*3/uL (ref 150–440)
RBC: 4.63 MIL/uL (ref 4.40–5.90)
RDW: 14.2 % (ref 11.5–14.5)
WBC: 6.3 10*3/uL (ref 3.8–10.6)

## 2017-02-27 LAB — COMPREHENSIVE METABOLIC PANEL
ALT: 17 U/L (ref 17–63)
AST: 26 U/L (ref 15–41)
Albumin: 3.2 g/dL — ABNORMAL LOW (ref 3.5–5.0)
Alkaline Phosphatase: 90 U/L (ref 38–126)
Anion gap: 10 (ref 5–15)
BUN: 26 mg/dL — AB (ref 6–20)
CHLORIDE: 101 mmol/L (ref 101–111)
CO2: 29 mmol/L (ref 22–32)
CREATININE: 1.02 mg/dL (ref 0.61–1.24)
Calcium: 8.8 mg/dL — ABNORMAL LOW (ref 8.9–10.3)
GFR calc Af Amer: >60 mL/min (ref >60)
GFR calc non Af Amer: >60 mL/min (ref >60)
GLUCOSE: 100 mg/dL — AB (ref 65–99)
Potassium: 3.7 mmol/L (ref 3.5–5.1)
Sodium: 140 mmol/L (ref 135–145)
Total Bilirubin: 0.5 mg/dL (ref 0.3–1.2)
Total Protein: 6.8 g/dL (ref 6.5–8.1)

## 2017-02-27 NOTE — Progress Notes (Signed)
Location:   [facility] Room Number: [place] of Service:  SNF [phone]) Provider:  Toni Arthurs, NP-C  [place], Leonie Douglas, MD  Patient Care Team: Idelle Crouch, MD as PCP - General (Internal Medicine)  Extended Emergency Contact Information Primary Emergency Contact: Jamey Ripa [place] Phone: [phone] Relation: Daughter Secondary Emergency Contact: Mauney,Jimmy  [place] Phone: [phone] Relation: Son  Code Status:  DNR Goals of care: Advanced Directive information Advanced Directives 02/27/2017  Does Patient Have a Medical Advance Directive? Yes  Type of Advance Directive Out of facility DNR (pink MOST or yellow form)  Does patient want to make changes to medical advance directive? No - Patient declined  Pre-existing out of facility DNR order (yellow form or pink MOST form) Yellow form placed in chart (order not valid for inpatient use)     Chief Complaint  Patient presents with  . Medical Management of Chronic Issues    Routine Visit    HPI:  Pt is a 84 y.o. male seen today for medical management of chronic diseases. Pt was admitted to the facility for rehab following hopstialization at [facility] for subdural hematoma, non-operable. Pt fell again at the facility and sustained a head trauma- causing worsening of the SDH. Family again opted for no treatment. Since return to the facility, pt is unable to participate in rehab. He is sedate the majority of the time. He continues to eat minimal and have weight loss. He has lost approximately 10 lbs in 3 weeks. He has a Actuary with him at times now. Family is looking into Assisted Living. Pt is being followed by Palliative Care while here on rehab. B/Ps have been trending low. Pt denies pain, chest pain or shortness of breath. Incontinent of B&B. No other complaints.   Please note pt with limited verbal ability. Unable to obtain complete ROS. Some ROS info  obtained from staff and documentation.       Past Medical History:  Diagnosis Date  . Arteriosclerotic cardiovascular disease    status post MI with PTCA and stent placement 2012  . BPH (benign prostatic hyperplasia)   . CAD (coronary artery disease)   . COPD (chronic obstructive pulmonary disease) ([place])   . Elevated PSA   . Fracture of left femur ([place])   . GERD (gastroesophageal reflux disease)   . Heart attack ([place])   . Hematuria   . HLD (hyperlipidemia)   . Hyperlipidemia, unspecified   . Hypertension   . Osteoporosis   . Urinary retention    Past Surgical History:  Procedure Laterality Date  . APPENDECTOMY    . Arm surgery Right   . CARDIAC CATHETERIZATION    . carotid surgery stent    . CORONARY ANGIOPLASTY    . ELBOW SURGERY Right   . HERNIA REPAIR     right inguinal   . HIP SURGERY Left 2006   left hip replacement    Allergies  Allergen Reactions  . Eggs Or Egg-Derived Products Nausea And Vomiting    Allergies as of 02/27/2017      Reactions   Eggs Or Egg-derived Products Nausea And Vomiting      Medication List        Accurate as of 02/27/17 10:22 AM. Always use your most recent med list.          acetaminophen 325 MG tablet Commonly known as:  TYLENOL Take 975 mg by mouth every 6 (six)  hours as needed.   ENSURE Take 237 mLs by mouth 2 (two) times daily between meals.   NUTRITIONAL SUPPLEMENTS PO Take by mouth. Give magic cup mixed with whole milk as bedtime snack for protein calorie malnutrition   feeding supplement (PRO-STAT SUGAR FREE 64) Liqd Take 30 mLs by mouth 2 (two) times daily between meals.   finasteride 5 MG tablet Commonly known as:  PROSCAR Take 1 tablet (5 mg total) by mouth daily.   metoprolol succinate 25 MG 24 hr tablet Commonly known as:  TOPROL-XL Take 12.5 mg by mouth 2 (two) times daily.   mirtazapine 7.5 MG tablet Commonly known as:  REMERON Take 7.5 mg by mouth at bedtime.   modafinil 100 MG tablet Commonly  known as:  PROVIGIL Take 1 tablet (100 mg total) by mouth daily.   nitroGLYCERIN 0.4 MG SL tablet Commonly known as:  NITROSTAT Place 0.4 mg under the tongue every 5 (five) minutes as needed for chest pain.   polyethylene glycol packet Commonly known as:  MIRALAX / GLYCOLAX Take 17 g by mouth daily.   SENNA S 8.6-50 MG tablet Generic drug:  senna-docusate Take 2 tablets by mouth 2 (two) times daily.       Review of Systems  Unable to perform ROS: Dementia  Constitutional: Negative for activity change, appetite change, chills, diaphoresis and fever.  HENT: Negative for congestion, mouth sores, nosebleeds, postnasal drip, sneezing, sore throat, trouble swallowing and voice change.   Respiratory: Negative for apnea, cough, choking, chest tightness, shortness of breath and wheezing.   Cardiovascular: Negative for chest pain, palpitations and leg swelling.  Gastrointestinal: Negative for abdominal distention, abdominal pain, constipation, diarrhea and nausea.  Genitourinary: Negative for difficulty urinating, dysuria, frequency and urgency.  Musculoskeletal: Positive for arthralgias (typical arthritis). Negative for back pain, gait problem and myalgias.  Skin: Negative for color change, pallor, rash and wound.  Neurological: Negative for dizziness, tremors, syncope, speech difficulty, weakness, numbness and headaches.  Psychiatric/Behavioral: Negative for agitation and behavioral problems.  All other systems reviewed and are negative.    There is no immunization history on file for this patient. Pertinent  Health Maintenance Due  Topic Date Due  . PNA vac Low Risk Adult (1 of 2 - PCV13) 10/18/1990  . INFLUENZA VACCINE  08/21/2016   No flowsheet data found. Functional Status Survey:    Vitals:   02/27/17 1013  BP: 90/75  Pulse: 84  Resp: 20  Temp: 97.8 F (36.6 C)  TempSrc: Oral  SpO2: 100%  Weight: 89 lb 14.4 oz (40.8 kg)  Height: 5' 1"  (1.549 m)   Body mass index  is 16.99 kg/m. Physical Exam  Constitutional: Vital signs are normal. He appears lethargic. He appears cachectic. He is active and cooperative. He does not appear ill. No distress.  HENT:  Head: Normocephalic and atraumatic.  Mouth/Throat: Uvula is midline, oropharynx is clear and moist and mucous membranes are normal. Mucous membranes are not pale, not dry and not cyanotic.  Eyes: Pupils are equal, round, and reactive to light. Conjunctivae, EOM and lids are normal.  Neck: Trachea normal, normal range of motion and full passive range of motion without pain. Neck supple. No JVD present. No tracheal deviation, no edema and no erythema present. No thyromegaly present.  Cardiovascular: Normal rate, regular rhythm, normal heart sounds, intact distal pulses and normal pulses. Exam reveals no gallop, no distant heart sounds and no friction rub.  No murmur heard. Pulses:      Dorsalis  pedis pulses are 2+ on the right side, and 2+ on the left side.  No edema  Pulmonary/Chest: Effort normal and breath sounds normal. No accessory muscle usage. No respiratory distress. He has no decreased breath sounds. He has no wheezes. He has no rhonchi. He has no rales. He exhibits no tenderness.  Abdominal: Soft. Normal appearance and bowel sounds are normal. He exhibits no distension and no ascites. There is no tenderness.  Musculoskeletal: Normal range of motion. He exhibits no edema or tenderness.  Expected osteoarthritis, stiffness; Bilateral Calves soft, supple. Negative Homan's Sign. B- pedal pulses equal  Neurological: He has normal strength. He appears lethargic. He displays atrophy. A cranial nerve deficit and sensory deficit is present. He exhibits abnormal muscle tone. Coordination and gait abnormal.  Skin: Skin is warm, dry and intact. He is not diaphoretic. No cyanosis. No pallor. Nails show no clubbing.  Psychiatric: He has a normal mood and affect. His speech is normal and behavior is normal. Thought  content normal. Cognition and memory are impaired. He expresses impulsivity and inappropriate judgment. He exhibits abnormal recent memory and abnormal remote memory.  Nursing note and vitals reviewed.   Labs reviewed: Recent Labs    01/14/17 2307 02/11/17 0445 02/15/17 1851  NA 134* 142 142  K 4.2 3.7 4.7  CL 102 104 104  CO2 26 27 29   GLUCOSE 115* 75 119*  BUN 9 29* 36*  CREATININE 1.13 1.04 0.97  CALCIUM 8.4* 9.0 9.6  MG  --  1.9  --    Recent Labs    06/24/16 1946 02/11/17 0445 02/15/17 1851  AST 26 29 36  ALT 12* 19 21  ALKPHOS 119 94 115  BILITOT 1.0 0.8 1.1  PROT 7.2 6.7 7.9  ALBUMIN 3.6 3.3* 4.1   Recent Labs    01/14/17 2307 02/11/17 0445 02/15/17 1851  WBC 8.9 9.4 8.8  NEUTROABS  --  6.5  --   HGB 13.6 13.2 15.7  HCT 40.5 39.8* 47.1  MCV 89.4 90.2 89.5  PLT 213 207 205   Lab Results  Component Value Date   TSH 2.214 02/11/2017   No results found for: HGBA1C No results found for: CHOL, HDL, LDLCALC, LDLDIRECT, TRIG, CHOLHDL  Significant Diagnostic Results in last 30 days:  Dg Lumbar Spine Complete  Result Date: 02/19/2017 CLINICAL DATA:  [place]. EXAM: LUMBAR SPINE - COMPLETE 4+ VIEW COMPARISON:  None. FINDINGS: Normal alignment on the lateral film. Suspect changes of ankylosing spondylitis or DISH. No acute fracture. The facets are normally aligned. No obvious pars defects. The visualized bony pelvis is grossly intact. A left hip prosthesis is noted. Advanced degenerative changes involving the right hip. IMPRESSION: Normal alignment and no acute bony findings. Electronically Signed   By: Marijo Sanes M.D.   On: 02/19/2017 10:22   Ct Head Wo Contrast  Result Date: 02/19/2017 CLINICAL DATA:  Unwitnessed fall.  Head trauma EXAM: CT HEAD WITHOUT CONTRAST TECHNIQUE: Contiguous axial images were obtained from the base of the skull through the vertex without intravenous contrast. COMPARISON:  Four days ago FINDINGS: Brain: Mixed density subdural hematoma  along the left cerebral convexity with high-density clot or septations. Maximal thickness is 13 mm, mildly decreased from prior where it measured up to 15 mm. Mild left cerebral mass-effect. Midline shift measures 4 mm at the septum pellucidum. Low-density right subdural collection towards the vertex, stable at up to 11 mm thick. Mild associated cortical mass effect. No acute blood products when compared to  prior. No infarct or hydrocephalus. Prominent medial temporal lobe atrophy, especially on the right.Patient has history of Alzheimer's disease. Vascular: Atherosclerotic calcification.  No hyperdense vessel. Skull: Negative Sinuses/Orbits: Negative IMPRESSION: 1. No acute finding when compared to head CT 4 days prior. 2. Mixed density subdural hematoma along the left cerebral convexity has mildly decreased, maximal thickness currently measuring 13 mm. 3. Low-density right convexity subdural collection measuring up to 11 mm thick, essentially size stable. 4. 4 mm of rightward midline shift. 5. Medial temporal atrophy in this patient with history of Alzheimer's disease. Electronically Signed   By: Monte Fantasia M.D.   On: 02/19/2017 10:31   Ct Head Wo Contrast  Result Date: 02/15/2017 CLINICAL DATA:  Minor head trauma, fell today, history of prior falls and intracranial hemorrhage EXAM: CT HEAD WITHOUT CONTRAST TECHNIQUE: Contiguous axial images were obtained from the base of the skull through the vertex without intravenous contrast. Sagittal and coronal MPR images reconstructed from axial data set. COMPARISON:  01/14/2017 FINDINGS: Brain: Generalized atrophy. Normal ventricular morphology. Acute on chronic LEFT frontoparietal subdural hematoma with a small amount of acute high attenuation hemorrhage. Collection measures up to 12 mm thick. Mild mass effect upon the LEFT hemisphere. In addition, a smaller subacute RIGHT parietal subdural hematoma is seen measuring 10 mm thick with mild mass effect upon the  RIGHT hemisphere. Approximately 5 mm of LEFT-to-RIGHT midline shift. Both subdural collections are new since the previous exam. No intraparenchymal or intraventricular hemorrhage seen. No evidence of acute infarction or mass. Empty sella. Vascular: Atherosclerotic calcification of internal carotid arteries bilaterally at skull base. No hyperdense vessels. Skull: Intact Sinuses/Orbits: Clear Other: N/A IMPRESSION: Acute on subacute LEFT frontoparietal subdural hematoma up to 12 mm thick. Subacute RIGHT parietal subdural hematoma up to 10 mm thick. Mass effect on both hemispheres with 5 mm of LEFT-to-RIGHT midline shift. These findings are new since the previous exam. Underlying atrophy with small vessel chronic ischemic changes of deep cerebral white matter. Critical Value/emergent results were called by telephone at the time of interpretation on 02/15/2017 at 6:28 pm to Dr. Lavonia Drafts , who verbally acknowledged these results. Electronically Signed   By: Lavonia Dana M.D.   On: 02/15/2017 18:28   Dg Hip Unilat W Or Wo Pelvis 2-3 Views Left  Result Date: 02/19/2017 CLINICAL DATA:  Found on floor.  Left hip pain.  Initial encounter. EXAM: DG HIP (WITH OR WITHOUT PELVIS) 2-3V LEFT COMPARISON:  02/15/2017 FINDINGS: Total left hip arthroplasty that is located. No evidence of periprosthetic fracture. No evidence of pelvis fracture or diastasis. The sacroiliac joints appear fused. Right hip osteoarthritis with spurring and joint narrowing. Osteopenia. IMPRESSION: No acute finding or change from prior. Electronically Signed   By: Monte Fantasia M.D.   On: 02/19/2017 10:20   Dg Hip Unilat With Pelvis 2-3 Views Left  Result Date: 02/15/2017 CLINICAL DATA:  Fall, pain all over EXAM: DG HIP (WITH OR WITHOUT PELVIS) 2-3V LEFT COMPARISON:  None FINDINGS: Diffuse osseous demineralization. LEFT hip prosthesis which appears intact. RIGHT hip joint space narrowing and minimal spur formation. No acute fracture, dislocation,  or bone destruction. IMPRESSION: LEFT hip prosthesis. Degenerative changes RIGHT hip joint. Osseous demineralization without acute abnormalities. Electronically Signed   By: Lavonia Dana M.D.   On: 02/15/2017 18:42    Assessment/Plan Subdural hematoma (HCC)  Continue with sitter  Monitor for safety  Fall precautions  Continue APAP 975 mg po Q 6 hours prn pain  Abnormal weight loss  Continue Mirtazapine 7.5 mg po Q HS for appetite stimulant  Continue Pro-stat 30 mL po BID  Continue Ensure Enlive 1 bottle po BID  Continue Magic cup with whole milk at bedtime for a snack  Late onset Alzheimer's disease without behavioral disturbance  Continue to monitor for safety  Continue to monitor for falls  Assist with ADLs and meals as appropriate  Continue with sitter as much as possible  Continue to be followed by Palliative Care  Family/ staff Communication:   Total Time:  Documentation:  Face to Face:  Family/Phone:   Labs/tests ordered:  Cbc, met c  Medication list reviewed and assessed for continued appropriateness. Monthly medication orders reviewed and signed.  Vikki Ports, NP-C Geriatrics [facility] [phone], [place] [address] Cell Phone (Mon-Fri 8am-5pm):  [phone] On Call:  [phone] & follow prompts after 5pm & weekends Office Phone:  [phone] Office Fax:  [phone]

## 2017-03-05 ENCOUNTER — Encounter: Payer: Self-pay | Admitting: Gerontology

## 2017-03-05 ENCOUNTER — Non-Acute Institutional Stay (SKILLED_NURSING_FACILITY): Payer: Medicare Other | Admitting: Gerontology

## 2017-03-05 DIAGNOSIS — R634 Abnormal weight loss: Secondary | ICD-10-CM | POA: Diagnosis not present

## 2017-03-05 DIAGNOSIS — F0280 Dementia in other diseases classified elsewhere without behavioral disturbance: Secondary | ICD-10-CM

## 2017-03-05 DIAGNOSIS — S065X9A Traumatic subdural hemorrhage with loss of consciousness of unspecified duration, initial encounter: Secondary | ICD-10-CM | POA: Diagnosis not present

## 2017-03-05 DIAGNOSIS — I959 Hypotension, unspecified: Secondary | ICD-10-CM

## 2017-03-05 DIAGNOSIS — S065XAA Traumatic subdural hemorrhage with loss of consciousness status unknown, initial encounter: Secondary | ICD-10-CM

## 2017-03-05 DIAGNOSIS — G301 Alzheimer's disease with late onset: Secondary | ICD-10-CM | POA: Diagnosis not present

## 2017-03-05 NOTE — Progress Notes (Signed)
Location:   [facility] Room Number: 201A Place of Service:  SNF [id number])  Provider: Lorenso QuarryShannon Brianah Hopson, NP-C  PCP: Marguarite ArbourSparks, Jeffrey D, MD Patient Care Team: Marguarite ArbourSparks, Jeffrey D, MD as PCP - General (Internal Medicine)  Extended Emergency Contact Information Primary Emergency Contact: Lyndee HensenGuffey,Julie  [place] Home Phone: [phone] Relation: Daughter Secondary Emergency Contact: Westly PamPaschal,Jimmy  [place] Home Phone: [phone] Relation: Son  Code Status: DNR Goals of care:  Advanced Directive information Advanced Directives 03/05/2017  Does Patient Have a Medical Advance Directive? Yes  Type of Advance Directive Out of facility DNR (pink MOST or yellow form)  Does patient want to make changes to medical advance directive? No - Patient declined  Pre-existing out of facility DNR order (yellow form or pink MOST form) Yellow form placed in chart (order not valid for inpatient use)     Allergies  Allergen Reactions  . Eggs Or Egg-Derived Products Nausea And Vomiting    Chief Complaint  Patient presents with  . Discharge Note    Discharged from SNF    HPI:  84 y.o. male was seen today for discharge evaluation. Pt was admitted to the facility for rehab following [facility] for [facility]H after a fall in his driveway onto concrete. While in the facility, pt sustained several other falls with worsening SDH. Pt family opted for conservative treatment and subsequently, comfort care. Pt has had ongoing weight loss, despite multiple interventions including appetite stimulant, nutritional supplements, etc. His B/Ps have been consistently low as well. Pt is no longer on any antihypertensives. Pt confusion rapidly progressed. He has to have a sitter much of the time for safety. He needs total assistance with ADLs. He sleeps the majority of the time.he is cachectic with temporal wasting. He was not able to fully participate in PT and OT  d/t the weakness, hypotension, AMS/ dementia. Pt has been followed by Palliative Care while here on rehab. He is scheduled to discharge to ALF and be followed by Hospice services while there. Family is in agreement with the plan. Pt denies pain, chest pains or shortness of breath. VSS. No other complaints.    Please note pt with limited verbal ability. Unable to obtain complete ROS. Some ROS info obtained from staff and documentation.      Past Medical History:  Diagnosis Date  . Arteriosclerotic cardiovascular disease    status post MI with PTCA and stent placement 2012  . BPH (benign prostatic hyperplasia)   . CAD (coronary artery disease)   . COPD (chronic obstructive pulmonary disease) (HCC)   . Elevated PSA   . Fracture of left femur (HCC)   . GERD (gastroesophageal reflux disease)   . Heart attack (HCC)   . Hematuria   . HLD (hyperlipidemia)   . Hyperlipidemia, unspecified   . Hypertension   . Osteoporosis   . Urinary retention     Past Surgical History:  Procedure Laterality Date  . APPENDECTOMY    . Arm surgery Right   . CARDIAC CATHETERIZATION    . carotid surgery stent    . CORONARY ANGIOPLASTY    . ELBOW SURGERY Right   . HERNIA REPAIR     right inguinal   . HIP SURGERY Left 2006   left hip replacement      reports that he has been smoking pipe.  he has never used smokeless tobacco. He reports that he does not drink alcohol or use drugs. Social History  Socioeconomic History  . Marital status: Widowed    Spouse name: Not on file  . Number of children: 4  . Years of education: 24  . Highest education level: 12th grade  Social Needs  . Financial resource strain: Not on file  . Food insecurity - worry: Not on file  . Food insecurity - inability: Not on file  . Transportation needs - medical: Not on file  . Transportation needs - non-medical: Not on file  Occupational History    Comment: retired IT trainer  Tobacco Use  . Smoking status:  Current Every Day Smoker    Types: Pipe  . Smokeless tobacco: Never Used  Substance and Sexual Activity  . Alcohol use: No    Alcohol/week: 0.0 oz  . Drug use: No  . Sexual activity: Not on file  Other Topics Concern  . Not on file  Social History Narrative   Former Hotel manager   Retired IT trainer   Widowed   4 children   Currently smokes a pipe   Alcohol - none   Full Code   Functional Status Survey:    Allergies  Allergen Reactions  . Eggs Or Egg-Derived Products Nausea And Vomiting    Pertinent  Health Maintenance Due  Topic Date Due  . PNA vac Low Risk Adult (1 of 2 - PCV13) 10/18/1990  . INFLUENZA VACCINE  08/21/2016    Medications: Allergies as of 03/05/2017      Reactions   Eggs Or Egg-derived Products Nausea And Vomiting      Medication List        Accurate as of 03/05/17  9:48 AM. Always use your most recent med list.          acetaminophen 325 MG tablet Commonly known as:  TYLENOL Take 975 mg by mouth every 6 (six) hours as needed.   atorvastatin 20 MG tablet Commonly known as:  LIPITOR Take 20 mg by mouth daily.   ENSURE Take 237 mLs by mouth 2 (two) times daily between meals.   NUTRITIONAL SUPPLEMENTS PO Take by mouth. Give magic cup mixed with whole milk as bedtime snack for protein calorie malnutrition   feeding supplement (PRO-STAT SUGAR FREE 64) Liqd Take 30 mLs by mouth 2 (two) times daily between meals.   finasteride 5 MG tablet Commonly known as:  PROSCAR Take 1 tablet (5 mg total) by mouth daily.   metoprolol succinate 25 MG 24 hr tablet Commonly known as:  TOPROL-XL Take 12.5 mg by mouth 2 (two) times daily.   mirtazapine 7.5 MG tablet Commonly known as:  REMERON Take 7.5 mg by mouth at bedtime.   modafinil 100 MG tablet Commonly known as:  PROVIGIL Take 1 tablet (100 mg total) by mouth daily.   nitroGLYCERIN 0.4 MG SL tablet Commonly known as:  NITROSTAT Place 0.4 mg under the tongue every 5 (five) minutes as  needed for chest pain.   polyethylene glycol packet Commonly known as:  MIRALAX / GLYCOLAX Take 17 g by mouth daily.   SENNA S 8.6-50 MG tablet Generic drug:  senna-docusate Take 2 tablets by mouth 2 (two) times daily.   Vitamin D3 2000 units Tabs Take 6,000 Units by mouth daily. 3 tabs       Review of Systems  Unable to perform ROS: Dementia  Constitutional: Positive for activity change, appetite change, fatigue and unexpected weight change. Negative for chills, diaphoresis and fever.  HENT: Negative for congestion, mouth sores, nosebleeds, postnasal drip, sneezing, sore throat,  trouble swallowing and voice change.   Respiratory: Negative for apnea, cough, choking, chest tightness, shortness of breath and wheezing.   Cardiovascular: Negative for chest pain, palpitations and leg swelling.  Gastrointestinal: Negative for abdominal distention, abdominal pain, constipation, diarrhea and nausea.  Genitourinary: Negative for difficulty urinating, dysuria, frequency and urgency.  Musculoskeletal: Positive for arthralgias (typical arthritis). Negative for back pain, gait problem and myalgias.  Skin: Negative for color change, pallor, rash and wound.  Neurological: Positive for weakness. Negative for dizziness, tremors, syncope, speech difficulty, numbness and headaches.  Psychiatric/Behavioral: Negative for agitation and behavioral problems.  All other systems reviewed and are negative.   Vitals:   03/05/17 0912  BP: (!) 121/56  Pulse: 72  Resp: 20  Temp: 97.6 F (36.4 C)  TempSrc: Oral  SpO2: 96%  Weight: 94 lb 4.8 oz (42.8 kg)  Height: 5\' 1"  (1.549 m)   Body mass index is 17.82 kg/m. Physical Exam  Constitutional: He is oriented to person, place, and time. Vital signs are normal. He appears lethargic. He appears cachectic. He is sleeping and cooperative. He appears ill. No distress.  HENT:  Head: Normocephalic and atraumatic.  Mouth/Throat: Uvula is midline, oropharynx is  clear and moist and mucous membranes are normal. Mucous membranes are not pale, not dry and not cyanotic.  Eyes: Pupils are equal, round, and reactive to light. Conjunctivae, EOM and lids are normal.  Neck: Trachea normal, normal range of motion and full passive range of motion without pain. Neck supple. No JVD present. No tracheal deviation, no edema and no erythema present. No thyromegaly present.  Cardiovascular: Normal rate, regular rhythm, normal heart sounds, intact distal pulses and normal pulses. Exam reveals no gallop, no distant heart sounds and no friction rub.  No murmur heard. Pulses:      Dorsalis pedis pulses are 2+ on the right side, and 2+ on the left side.  No edema  Pulmonary/Chest: Effort normal. No accessory muscle usage. No respiratory distress. He has decreased breath sounds in the right lower field and the left lower field. He has no wheezes. He has no rhonchi. He has no rales. He exhibits no tenderness.  Abdominal: Soft. Normal appearance and bowel sounds are normal. He exhibits no distension and no ascites. There is no tenderness.  Musculoskeletal: Normal range of motion. He exhibits no edema or tenderness.  Expected osteoarthritis, stiffness; Bilateral Calves soft, supple. Negative Homan's Sign. B- pedal pulses equal; generalized weakness  Neurological: He is oriented to person, place, and time. He has normal strength. He appears lethargic. He displays atrophy. A cranial nerve deficit and sensory deficit is present. He exhibits abnormal muscle tone. Coordination and gait abnormal.  Skin: Skin is warm, dry and intact. He is not diaphoretic. No cyanosis. No pallor. Nails show no clubbing.  Psychiatric: He has a normal mood and affect. His speech is normal and behavior is normal. Thought content normal. Cognition and memory are impaired. He expresses impulsivity and inappropriate judgment. He exhibits abnormal recent memory and abnormal remote memory.  Nursing note and vitals  reviewed.   Labs reviewed: Basic Metabolic Panel: Recent Labs    02/11/17 0445 02/15/17 1851 02/27/17 1700  NA 142 142 140  K 3.7 4.7 3.7  CL 104 104 101  CO2 27 29 29   GLUCOSE 75 119* 100*  BUN 29* 36* 26*  CREATININE 1.04 0.97 1.02  CALCIUM 9.0 9.6 8.8*  MG 1.9  --   --    Liver Function Tests: Recent Labs  02/11/17 0445 02/15/17 1851 02/27/17 1700  AST 29 36 26  ALT 19 21 17   ALKPHOS 94 115 90  BILITOT 0.8 1.1 0.5  PROT 6.7 7.9 6.8  ALBUMIN 3.3* 4.1 3.2*   No results for input(s): LIPASE, AMYLASE in the last 8760 hours. No results for input(s): AMMONIA in the last 8760 hours. CBC: Recent Labs    02/11/17 0445 02/15/17 1851 02/27/17 1902  WBC 9.4 8.8 6.3  NEUTROABS 6.5  --  3.9  HGB 13.2 15.7 13.6  HCT 39.8* 47.1 42.0  MCV 90.2 89.5 90.7  PLT 207 205 213   Cardiac Enzymes: Recent Labs    06/24/16 1946 01/14/17 2307  TROPONINI <0.03 <0.03   BNP: Invalid input(s): POCBNP CBG: No results for input(s): GLUCAP in the last 8760 hours.  Procedures and Imaging Studies During Stay: Dg Lumbar Spine Complete  Result Date: 02/19/2017 CLINICAL DATA:  Larey Seat. EXAM: LUMBAR SPINE - COMPLETE 4+ VIEW COMPARISON:  None. FINDINGS: Normal alignment on the lateral film. Suspect changes of ankylosing spondylitis or DISH. No acute fracture. The facets are normally aligned. No obvious pars defects. The visualized bony pelvis is grossly intact. A left hip prosthesis is noted. Advanced degenerative changes involving the right hip. IMPRESSION: Normal alignment and no acute bony findings. Electronically Signed   By: Rudie Meyer M.D.   On: 02/19/2017 10:22   Ct Head Wo Contrast  Result Date: 02/19/2017 CLINICAL DATA:  Unwitnessed fall.  Head trauma EXAM: CT HEAD WITHOUT CONTRAST TECHNIQUE: Contiguous axial images were obtained from the base of the skull through the vertex without intravenous contrast. COMPARISON:  Four days ago FINDINGS: Brain: Mixed density subdural hematoma  along the left cerebral convexity with high-density clot or septations. Maximal thickness is 13 mm, mildly decreased from prior where it measured up to 15 mm. Mild left cerebral mass-effect. Midline shift measures 4 mm at the septum pellucidum. Low-density right subdural collection towards the vertex, stable at up to 11 mm thick. Mild associated cortical mass effect. No acute blood products when compared to prior. No infarct or hydrocephalus. Prominent medial temporal lobe atrophy, especially on the right.Patient has history of Alzheimer's disease. Vascular: Atherosclerotic calcification.  No hyperdense vessel. Skull: Negative Sinuses/Orbits: Negative IMPRESSION: 1. No acute finding when compared to head CT 4 days prior. 2. Mixed density subdural hematoma along the left cerebral convexity has mildly decreased, maximal thickness currently measuring 13 mm. 3. Low-density right convexity subdural collection measuring up to 11 mm thick, essentially size stable. 4. 4 mm of rightward midline shift. 5. Medial temporal atrophy in this patient with history of Alzheimer's disease. Electronically Signed   By: Marnee Spring M.D.   On: 02/19/2017 10:31   Ct Head Wo Contrast  Result Date: 02/15/2017 CLINICAL DATA:  Minor head trauma, fell today, history of prior falls and intracranial hemorrhage EXAM: CT HEAD WITHOUT CONTRAST TECHNIQUE: Contiguous axial images were obtained from the base of the skull through the vertex without intravenous contrast. Sagittal and coronal MPR images reconstructed from axial data set. COMPARISON:  01/14/2017 FINDINGS: Brain: Generalized atrophy. Normal ventricular morphology. Acute on chronic LEFT frontoparietal subdural hematoma with a small amount of acute high attenuation hemorrhage. Collection measures up to 12 mm thick. Mild mass effect upon the LEFT hemisphere. In addition, a smaller subacute RIGHT parietal subdural hematoma is seen measuring 10 mm thick with mild mass effect upon the  RIGHT hemisphere. Approximately 5 mm of LEFT-to-RIGHT midline shift. Both subdural collections are new since the previous exam.  No intraparenchymal or intraventricular hemorrhage seen. No evidence of acute infarction or mass. Empty sella. Vascular: Atherosclerotic calcification of internal carotid arteries bilaterally at skull base. No hyperdense vessels. Skull: Intact Sinuses/Orbits: Clear Other: N/A IMPRESSION: Acute on subacute LEFT frontoparietal subdural hematoma up to 12 mm thick. Subacute RIGHT parietal subdural hematoma up to 10 mm thick. Mass effect on both hemispheres with 5 mm of LEFT-to-RIGHT midline shift. These findings are new since the previous exam. Underlying atrophy with small vessel chronic ischemic changes of deep cerebral white matter. Critical Value/emergent results were called by telephone at the time of interpretation on 02/15/2017 at 6:28 pm to Dr. Jene Every , who verbally acknowledged these results. Electronically Signed   By: Ulyses Southward M.D.   On: 02/15/2017 18:28   Dg Hip Unilat W Or Wo Pelvis 2-3 Views Left  Result Date: 02/19/2017 CLINICAL DATA:  Found on floor.  Left hip pain.  Initial encounter. EXAM: DG HIP (WITH OR WITHOUT PELVIS) 2-3V LEFT COMPARISON:  02/15/2017 FINDINGS: Total left hip arthroplasty that is located. No evidence of periprosthetic fracture. No evidence of pelvis fracture or diastasis. The sacroiliac joints appear fused. Right hip osteoarthritis with spurring and joint narrowing. Osteopenia. IMPRESSION: No acute finding or change from prior. Electronically Signed   By: Marnee Spring M.D.   On: 02/19/2017 10:20   Dg Hip Unilat With Pelvis 2-3 Views Left  Result Date: 02/15/2017 CLINICAL DATA:  Fall, pain all over EXAM: DG HIP (WITH OR WITHOUT PELVIS) 2-3V LEFT COMPARISON:  None FINDINGS: Diffuse osseous demineralization. LEFT hip prosthesis which appears intact. RIGHT hip joint space narrowing and minimal spur formation. No acute fracture, dislocation,  or bone destruction. IMPRESSION: LEFT hip prosthesis. Degenerative changes RIGHT hip joint. Osseous demineralization without acute abnormalities. Electronically Signed   By: Ulyses Southward M.D.   On: 02/15/2017 18:42    Assessment/Plan:   Subdural hematoma (HCC)  Stable  No treatment  Abnormal weight loss  Continue mirtazapine 7.5 mg po Q HS  Continue Pro Stat 30 ml po BID  Continue Ensure Enlive po BID  Continue Magic Cup with whole milk at HS  Late onset Alzheimer's disease without behavioral disturbance  Corporate investment banker for safety  Continue to assist with ADLs and meals as needed  Continue to monitor for safety  Continue to monitor for falls  Refer to Hospice services upon discharge  Hypotension, unspecified hypotension type  Stable  No treatment  Monitor for safety    Patient is being discharged with the following home health services: [facility]   Patient is being discharged with the following durable medical equipment: Hospital bed, over bed table, BSC, wheelchair- provided by Hospice   Patient has been advised to f/u with their PCP in 1-2 weeks to bring them up to date on their rehab stay.  Social services at facility was responsible for arranging this appointment.  Pt was provided with a 30 day supply of prescriptions for medications and refills must be obtained from their PCP.  For controlled substances, a more limited supply may be provided adequate until PCP appointment only.  Future labs/tests needed:    Family/ staff Communication:   Total Time:  Documentation:  Face to Face:  Family/Phone:  Brynda Rim, NP-C Geriatrics [facility] [address] [place], [place] [address] Cell Phone (Mon-Fri 8am-5pm):  [phone] On Call:  [phone] & follow prompts after 5pm & weekends Office Phone:  [phone] Office Fax:  [phone]

## 2017-10-21 ENCOUNTER — Inpatient Hospital Stay
Admission: EM | Admit: 2017-10-21 | Discharge: 2017-10-22 | DRG: 087 | Disposition: A | Discharge status: Hospice | Attending: Internal Medicine | Admitting: Internal Medicine

## 2017-10-21 ENCOUNTER — Encounter: Payer: Self-pay | Admitting: *Deleted

## 2017-10-21 ENCOUNTER — Emergency Department

## 2017-10-21 ENCOUNTER — Other Ambulatory Visit: Payer: Self-pay

## 2017-10-21 DIAGNOSIS — F0280 Dementia in other diseases classified elsewhere without behavioral disturbance: Secondary | ICD-10-CM | POA: Diagnosis present

## 2017-10-21 DIAGNOSIS — J449 Chronic obstructive pulmonary disease, unspecified: Secondary | ICD-10-CM | POA: Diagnosis present

## 2017-10-21 DIAGNOSIS — M810 Age-related osteoporosis without current pathological fracture: Secondary | ICD-10-CM | POA: Diagnosis present

## 2017-10-21 DIAGNOSIS — Z91012 Allergy to eggs: Secondary | ICD-10-CM | POA: Diagnosis not present

## 2017-10-21 DIAGNOSIS — I252 Old myocardial infarction: Secondary | ICD-10-CM

## 2017-10-21 DIAGNOSIS — Z96642 Presence of left artificial hip joint: Secondary | ICD-10-CM | POA: Diagnosis present

## 2017-10-21 DIAGNOSIS — Z955 Presence of coronary angioplasty implant and graft: Secondary | ICD-10-CM

## 2017-10-21 DIAGNOSIS — W19XXXA Unspecified fall, initial encounter: Secondary | ICD-10-CM

## 2017-10-21 DIAGNOSIS — F419 Anxiety disorder, unspecified: Secondary | ICD-10-CM | POA: Diagnosis present

## 2017-10-21 DIAGNOSIS — R296 Repeated falls: Secondary | ICD-10-CM | POA: Diagnosis present

## 2017-10-21 DIAGNOSIS — Z7189 Other specified counseling: Secondary | ICD-10-CM | POA: Diagnosis not present

## 2017-10-21 DIAGNOSIS — Z9181 History of falling: Secondary | ICD-10-CM | POA: Diagnosis not present

## 2017-10-21 DIAGNOSIS — K219 Gastro-esophageal reflux disease without esophagitis: Secondary | ICD-10-CM | POA: Diagnosis present

## 2017-10-21 DIAGNOSIS — I10 Essential (primary) hypertension: Secondary | ICD-10-CM | POA: Diagnosis present

## 2017-10-21 DIAGNOSIS — Z7982 Long term (current) use of aspirin: Secondary | ICD-10-CM

## 2017-10-21 DIAGNOSIS — E785 Hyperlipidemia, unspecified: Secondary | ICD-10-CM | POA: Diagnosis present

## 2017-10-21 DIAGNOSIS — Z79899 Other long term (current) drug therapy: Secondary | ICD-10-CM

## 2017-10-21 DIAGNOSIS — N400 Enlarged prostate without lower urinary tract symptoms: Secondary | ICD-10-CM | POA: Diagnosis present

## 2017-10-21 DIAGNOSIS — Z993 Dependence on wheelchair: Secondary | ICD-10-CM

## 2017-10-21 DIAGNOSIS — F17200 Nicotine dependence, unspecified, uncomplicated: Secondary | ICD-10-CM | POA: Diagnosis present

## 2017-10-21 DIAGNOSIS — G309 Alzheimer's disease, unspecified: Secondary | ICD-10-CM | POA: Diagnosis present

## 2017-10-21 DIAGNOSIS — I609 Nontraumatic subarachnoid hemorrhage, unspecified: Secondary | ICD-10-CM

## 2017-10-21 DIAGNOSIS — Z66 Do not resuscitate: Secondary | ICD-10-CM | POA: Diagnosis not present

## 2017-10-21 DIAGNOSIS — S066X0A Traumatic subarachnoid hemorrhage without loss of consciousness, initial encounter: Principal | ICD-10-CM

## 2017-10-21 DIAGNOSIS — Z515 Encounter for palliative care: Secondary | ICD-10-CM | POA: Diagnosis not present

## 2017-10-21 DIAGNOSIS — I2510 Atherosclerotic heart disease of native coronary artery without angina pectoris: Secondary | ICD-10-CM | POA: Diagnosis present

## 2017-10-21 LAB — CBC WITH DIFFERENTIAL/PLATELET
Basophils Absolute: 0.1 10*3/uL (ref 0–0.1)
Basophils Relative: 1 %
Eosinophils Absolute: 0.3 10*3/uL (ref 0–0.7)
Eosinophils Relative: 4 %
HCT: 38.3 % — ABNORMAL LOW (ref 40.0–52.0)
HEMOGLOBIN: 13 g/dL (ref 13.0–18.0)
LYMPHS ABS: 1.7 10*3/uL (ref 1.0–3.6)
LYMPHS PCT: 21 %
MCH: 30.6 pg (ref 26.0–34.0)
MCHC: 33.9 g/dL (ref 32.0–36.0)
MCV: 90.4 fL (ref 80.0–100.0)
MONOS PCT: 8 %
Monocytes Absolute: 0.7 10*3/uL (ref 0.2–1.0)
NEUTROS ABS: 5.4 10*3/uL (ref 1.4–6.5)
NEUTROS PCT: 66 %
Platelets: 253 10*3/uL (ref 150–440)
RBC: 4.23 MIL/uL — AB (ref 4.40–5.90)
RDW: 14 % (ref 11.5–14.5)
WBC: 8.2 10*3/uL (ref 3.8–10.6)

## 2017-10-21 LAB — PROTIME-INR
INR: 1.04
Prothrombin Time: 13.5 seconds (ref 11.4–15.2)

## 2017-10-21 LAB — URINALYSIS, COMPLETE (UACMP) WITH MICROSCOPIC
Bilirubin Urine: NEGATIVE
Glucose, UA: NEGATIVE mg/dL
Hgb urine dipstick: NEGATIVE
Ketones, ur: NEGATIVE mg/dL
NITRITE: NEGATIVE
PH: 8 (ref 5.0–8.0)
Protein, ur: NEGATIVE mg/dL
SPECIFIC GRAVITY, URINE: 1.006 (ref 1.005–1.030)
SQUAMOUS EPITHELIAL / LPF: NONE SEEN (ref 0–5)
WBC, UA: >50 WBC/hpf — ABNORMAL HIGH (ref 0–5)

## 2017-10-21 LAB — BASIC METABOLIC PANEL
Anion gap: 7 (ref 5–15)
BUN: 12 mg/dL (ref 8–23)
CHLORIDE: 98 mmol/L (ref 98–111)
CO2: 31 mmol/L (ref 22–32)
CREATININE: 1.18 mg/dL (ref 0.61–1.24)
Calcium: 8.6 mg/dL — ABNORMAL LOW (ref 8.9–10.3)
GFR calc Af Amer: 60 mL/min — ABNORMAL LOW (ref >60)
GFR calc non Af Amer: 52 mL/min — ABNORMAL LOW (ref >60)
Glucose, Bld: 110 mg/dL — ABNORMAL HIGH (ref 70–99)
POTASSIUM: 5.4 mmol/L — AB (ref 3.5–5.1)
SODIUM: 136 mmol/L (ref 135–145)

## 2017-10-21 LAB — APTT: aPTT: 39 seconds — ABNORMAL HIGH (ref 24–36)

## 2017-10-21 MED ORDER — FENTANYL CITRATE (PF) 100 MCG/2ML IJ SOLN
25.0000 ug | Freq: Once | INTRAMUSCULAR | Status: AC
  Administered 2017-10-21: 25 ug via INTRAVENOUS
  Filled 2017-10-21: qty 2

## 2017-10-21 MED ORDER — MORPHINE SULFATE (PF) 2 MG/ML IV SOLN: 1.0000 mg | INTRAVENOUS | Status: DC | PRN

## 2017-10-21 MED ORDER — ACETAMINOPHEN 650 MG RE SUPP: 650.0000 mg | Freq: Four times a day (QID) | RECTAL | Status: DC | PRN

## 2017-10-21 MED ORDER — ONDANSETRON HCL 4 MG PO TABS: 4.0000 mg | ORAL_TABLET | Freq: Four times a day (QID) | ORAL | Status: DC | PRN

## 2017-10-21 MED ORDER — ACETAMINOPHEN 325 MG PO TABS: 650.0000 mg | ORAL_TABLET | Freq: Four times a day (QID) | ORAL | Status: DC | PRN

## 2017-10-21 MED ORDER — LORAZEPAM 2 MG/ML IJ SOLN: 1.0000 mg | INTRAMUSCULAR | Status: DC | PRN

## 2017-10-21 MED ORDER — DEXTROSE 50 % IV SOLN
1.0000 | Freq: Once | INTRAVENOUS | Status: AC
  Administered 2017-10-21: 50 mL via INTRAVENOUS
  Filled 2017-10-21: qty 50

## 2017-10-21 MED ORDER — SODIUM BICARBONATE 8.4 % IV SOLN
50.0000 meq | Freq: Once | INTRAVENOUS | Status: AC
  Administered 2017-10-21: 50 meq via INTRAVENOUS
  Filled 2017-10-21: qty 50

## 2017-10-21 MED ORDER — ONDANSETRON HCL 4 MG/2ML IJ SOLN: 4.0000 mg | Freq: Four times a day (QID) | INTRAMUSCULAR | Status: DC | PRN

## 2017-10-21 MED ORDER — TETANUS-DIPHTH-ACELL PERTUSSIS 5-2.5-18.5 LF-MCG/0.5 IM SUSP
0.5000 mL | Freq: Once | INTRAMUSCULAR | Status: DC
  Filled 2017-10-21: qty 0.5

## 2017-10-21 MED ORDER — NICARDIPINE HCL IN NACL 20-0.86 MG/200ML-% IV SOLN
3.0000 mg/h | Freq: Once | INTRAVENOUS | Status: AC
  Administered 2017-10-21: 3 mg/h via INTRAVENOUS
  Filled 2017-10-21: qty 200

## 2017-10-21 MED ORDER — SODIUM CHLORIDE 0.9 % IV SOLN: INTRAVENOUS | Status: DC

## 2017-10-21 MED ORDER — SODIUM CHLORIDE 0.9 % IV SOLN
1.0000 g | Freq: Once | INTRAVENOUS | Status: AC
  Administered 2017-10-21: 1 g via INTRAVENOUS
  Filled 2017-10-21: qty 10

## 2017-10-21 MED ORDER — INSULIN ASPART 100 UNIT/ML ~~LOC~~ SOLN
10.0000 [IU] | Freq: Once | SUBCUTANEOUS | Status: AC
  Administered 2017-10-21: 10 [IU] via INTRAVENOUS
  Filled 2017-10-21: qty 1

## 2017-10-21 MED ORDER — HYDRALAZINE HCL 20 MG/ML IJ SOLN: 10.0000 mg | Freq: Four times a day (QID) | INTRAMUSCULAR | Status: DC | PRN

## 2017-10-21 MED ORDER — SODIUM CHLORIDE 0.9 % IV SOLN
Freq: Once | INTRAVENOUS | Status: AC
  Administered 2017-10-21: 19:00:00 via INTRAVENOUS

## 2017-10-21 NOTE — ED Notes (Signed)
Call back to floor to give report 

## 2017-10-21 NOTE — H&P (Signed)
[facility] - [place] at [facility]   PATIENT NAME: Eric [name]    MR#:  [id number]  DATE OF BIRTH:  06-24-1923  DATE OF ADMISSION:  10/21/2017  PRIMARY CARE PHYSICIAN: Marguarite Arbour, MD   REQUESTING/REFERRING PHYSICIAN: Dr. Don Perking  CHIEF COMPLAINT:   Chief Complaint  Patient presents with  . Fall    HISTORY OF PRESENT ILLNESS:  Eric [name]  is a 84 y.o. male with a known history of worsening dementia, subdural bleed, hypertension, COPD presents to the emergency room after having an unwitnessed fall from wheelchair.  CT scan of the head showed subarachnoid bleed and patient has GCS of 7.  Neurosurgery was consulted.  Family does not want any aggressive surgeries or repeat imaging.  Patient is on hospice services.  Initially patient was started on nicardipine drip and blood pressure was dropped to 101/50 and has been stopped.  Family is requesting that comfort be a priority but monitor overnight for any improvement.  PAST MEDICAL HISTORY:   Past Medical History:  Diagnosis Date  . Arteriosclerotic cardiovascular disease    status post MI with PTCA and stent placement 2012  . BPH (benign prostatic hyperplasia)   . CAD (coronary artery disease)   . COPD (chronic obstructive pulmonary disease) (HCC)   . Elevated PSA   . Fracture of left femur (HCC)   . GERD (gastroesophageal reflux disease)   . Heart attack (HCC)   . Hematuria   . HLD (hyperlipidemia)   . Hyperlipidemia, unspecified   . Hypertension   . Osteoporosis   . Urinary retention     PAST SURGICAL HISTORY:   Past Surgical History:  Procedure Laterality Date  . APPENDECTOMY    . Arm surgery Right   . CARDIAC CATHETERIZATION    . carotid surgery stent    . CORONARY ANGIOPLASTY    . ELBOW SURGERY Right   . HERNIA REPAIR     right inguinal   . HIP SURGERY Left 2006   left hip replacement    SOCIAL HISTORY:   Social History   Tobacco Use  . Smoking status: Current Every Day Smoker     Types: Pipe  . Smokeless tobacco: Never Used  Substance Use Topics  . Alcohol use: No    Alcohol/week: 0.0 standard drinks    FAMILY HISTORY:   Family History  Problem Relation Age of Onset  . Stroke Mother   . Arthritis Mother   . Heart attack Father   . Diabetes Father   . Stroke Brother   . Prostate cancer Neg Hx   . Bladder Cancer Neg Hx     DRUG ALLERGIES:   Allergies  Allergen Reactions  . Eggs Or Egg-Derived Products Nausea And Vomiting    REVIEW OF SYSTEMS:   Review of Systems  Unable to perform ROS: Dementia    MEDICATIONS AT HOME:   Prior to Admission medications   Medication Sig Start Date End Date Taking? Authorizing Provider  acetaminophen (TYLENOL) 325 MG tablet Take 650 mg by mouth every 4 (four) hours as needed for mild pain or moderate pain.    Yes [provider]  acetaminophen (TYLENOL) 500 MG tablet Take 500 mg by mouth every 6 (six) hours as needed for fever.   Yes [provider]  alum & mag hydroxide-simeth (MINTOX REGULAR STRENGTH) 200-200-20 MG/5ML suspension Take 30 mLs by mouth every 6 (six) hours as needed for indigestion or heartburn (max 4 doses daily).   Yes [provider]  Amino Acids-Protein Hydrolys (FEEDING SUPPLEMENT, PRO-STAT SUGAR FREE 64,) LIQD Take 30 mLs by mouth 3 (three) times daily.    Yes [provider]  aspirin 81 MG chewable tablet Chew 81 mg by mouth daily.   Yes [provider]  atorvastatin (LIPITOR) 20 MG tablet Take 20 mg by mouth at bedtime.    Yes [provider]  finasteride (PROSCAR) 5 MG tablet Take 1 tablet (5 mg total) by mouth daily. 01/09/17  Yes McGowan, Carollee Herter A, PA-C  guaiFENesin (ROBITUSSIN) 100 MG/5ML SOLN Take 10 mLs by mouth every 6 (six) hours as needed for cough or to loosen phlegm (max 4 doses daily).   Yes [provider]  loperamide (IMODIUM) 2 MG capsule Take 2 mg by mouth as needed for diarrhea or loose stools (max 8 doses  daily).   Yes [provider]  magnesium hydroxide (MILK OF MAGNESIA) 400 MG/5ML suspension Take 30 mLs by mouth at bedtime as needed for mild constipation.   Yes [provider]  metoprolol succinate (TOPROL-XL) 25 MG 24 hr tablet Take 12.5 mg by mouth 2 (two) times daily.    Yes [provider]  neomycin-bacitracin-polymyxin (NEOSPORIN) OINT Apply 1 application topically 4 (four) times daily as needed for wound care.   Yes [provider]  nitroGLYCERIN (NITROSTAT) 0.4 MG SL tablet Place 0.4 mg under the tongue every 5 (five) minutes as needed for chest pain.    Yes [provider]  polyethylene glycol (MIRALAX / GLYCOLAX) packet Take 17 g by mouth daily.   Yes [provider]  senna-docusate (SENNA S) 8.6-50 MG tablet Take 2 tablets by mouth 2 (two) times daily.   Yes [provider]  modafinil (PROVIGIL) 100 MG tablet Take 1 tablet (100 mg total) by mouth daily. Patient not taking: Reported on 10/21/2017 02/19/17   Lorenso Quarry, NP     VITAL SIGNS:  Blood pressure (!) 155/79, pulse (!) 102, resp. rate 16, SpO2 96 %.  PHYSICAL EXAMINATION:  Physical Exam  GENERAL:  85 y.o.-year-old patient lying in the bed. EYES: Pupils bilaterally fixed HEENT: Head  .  Small hematoma with blood on the scalp on left temporal area NECK:  Supple, no jugular venous distention.  LUNGS: Normal breath sounds bilaterally, no wheezing, rales, rhonchi. No use of accessory muscles of respiration.  CARDIOVASCULAR: S1, S2 normal. No murmurs, rubs, or gallops.  ABDOMEN: Soft, nontender, nondistended. Bowel sounds present. No organomegaly or mass.  EXTREMITIES: No pedal edema, cyanosis, or clubbing. + 2 pedal & radial pulses b/l.   NEUROLOGIC: Not following instructions PSYCHIATRIC: The patient is drowsy SKIN: Bruising  LABORATORY PANEL:   CBC Recent Labs  Lab 10/21/17 1717  WBC 8.2  HGB 13.0  HCT 38.3*  PLT 253    ------------------------------------------------------------------------------------------------------------------  Chemistries  Recent Labs  Lab 10/21/17 1717  NA 136  K 5.4*  CL 98  CO2 31  GLUCOSE 110*  BUN 12  CREATININE 1.18  CALCIUM 8.6*   ------------------------------------------------------------------------------------------------------------------  Cardiac Enzymes No results for input(s): TROPONINI in the last 168 hours. ------------------------------------------------------------------------------------------------------------------  RADIOLOGY:  Ct Head Wo Contrast  Result Date: 10/21/2017 CLINICAL DATA:  Pt had an unwitenssed fall at [employer]. Pt has injury (hematoma, bloody) to left side of head. No other injuries noted. Pt is not verbal at this time. Responsive to tactile stimuli. Pt ambulatory and verbal at baseline per ems report. EXAM: CT HEAD WITHOUT CONTRAST CT CERVICAL SPINE WITHOUT CONTRAST TECHNIQUE: Multidetector CT  imaging of the head and cervical spine was performed following the standard protocol without intravenous contrast. Multiplanar CT image reconstructions of the cervical spine were also generated. COMPARISON:  02/19/2017 FINDINGS: CT HEAD FINDINGS Brain: There is acute subarachnoid hemorrhage along the quadrigeminal plate cistern. There is a small area of parenchymal contusion along the inferolateral right temporal lobe. No other acute intracranial hemorrhage. Ventricles normal in configuration. There is ventricular and sulcal enlargement reflecting mild generalized atrophy. This is stable. No convincing hydrocephalus. No evidence of an ischemic infarct. Vascular: No hyperdense vessel or unexpected calcification. Skull: Normal. Negative for fracture or focal lesion. Sinuses/Orbits: Globes and orbits are unremarkable. Dependent fluid is seen in the left maxillary sinus, posterior left ethmoid air cells and right sphenoid sinus. Mild left ethmoid  sinus mucosal thickening. Other: Left scalp hematoma. CT CERVICAL SPINE FINDINGS Alignment: Normal. Skull base and vertebrae: No acute fracture. No primary bone lesion or focal pathologic process. Soft tissues and spinal canal: No prevertebral fluid or swelling. No visible canal hematoma. Disc levels: There is biconcave depressions of vertebral endplates throughout the cervical spine, which appears degenerative in origin. Degenerative facet disease noted bilaterally greatest on the right at C4-C5 and left C5-C6. No convincing disc herniation. Skeletal structures are diffusely demineralized Upper chest: No soft tissue masses or adenopathy. Lung apices demonstrate pleuroparenchymal scarring mild bronchiectasis and areas of bronchial mucous plugging at the right apex. No convincing acute finding. Other: None. IMPRESSION: HEAD CT 1. Small amount of acute subarachnoid hemorrhage is noted in the quadrigeminal plate cistern at and just below the tentorial incisura. 2. Small area of parenchymal contusion along the inferolateral right temporal lobe. 3. No other acute intracranial abnormality. 4. Left scalp hematoma.  No skull fracture. CERVICAL CT 1. No fracture or acute finding. Critical Value/emergent results were called by telephone at the time of interpretation on 10/21/2017 at 6:03 pm to Dr. Nita Sickle , who verbally acknowledged these results. Electronically Signed   By: Amie Portland M.D.   On: 10/21/2017 18:00   Ct Cervical Spine Wo Contrast  Result Date: 10/21/2017 CLINICAL DATA:  Pt had an unwitenssed fall at [employer]. Pt has injury (hematoma, bloody) to left side of head. No other injuries noted. Pt is not verbal at this time. Responsive to tactile stimuli. Pt ambulatory and verbal at baseline per ems report. EXAM: CT HEAD WITHOUT CONTRAST CT CERVICAL SPINE WITHOUT CONTRAST TECHNIQUE: Multidetector CT imaging of the head and cervical spine was performed following the standard protocol without  intravenous contrast. Multiplanar CT image reconstructions of the cervical spine were also generated. COMPARISON:  02/19/2017 FINDINGS: CT HEAD FINDINGS Brain: There is acute subarachnoid hemorrhage along the quadrigeminal plate cistern. There is a small area of parenchymal contusion along the inferolateral right temporal lobe. No other acute intracranial hemorrhage. Ventricles normal in configuration. There is ventricular and sulcal enlargement reflecting mild generalized atrophy. This is stable. No convincing hydrocephalus. No evidence of an ischemic infarct. Vascular: No hyperdense vessel or unexpected calcification. Skull: Normal. Negative for fracture or focal lesion. Sinuses/Orbits: Globes and orbits are unremarkable. Dependent fluid is seen in the left maxillary sinus, posterior left ethmoid air cells and right sphenoid sinus. Mild left ethmoid sinus mucosal thickening. Other: Left scalp hematoma. CT CERVICAL SPINE FINDINGS Alignment: Normal. Skull base and vertebrae: No acute fracture. No primary bone lesion or focal pathologic process. Soft tissues and spinal canal: No prevertebral fluid or swelling. No visible canal hematoma. Disc levels: There is biconcave depressions of vertebral endplates throughout  the cervical spine, which appears degenerative in origin. Degenerative facet disease noted bilaterally greatest on the right at C4-C5 and left C5-C6. No convincing disc herniation. Skeletal structures are diffusely demineralized Upper chest: No soft tissue masses or adenopathy. Lung apices demonstrate pleuroparenchymal scarring mild bronchiectasis and areas of bronchial mucous plugging at the right apex. No convincing acute finding. Other: None. IMPRESSION: HEAD CT 1. Small amount of acute subarachnoid hemorrhage is noted in the quadrigeminal plate cistern at and just below the tentorial incisura. 2. Small area of parenchymal contusion along the inferolateral right temporal lobe. 3. No other acute  intracranial abnormality. 4. Left scalp hematoma.  No skull fracture. CERVICAL CT 1. No fracture or acute finding. Critical Value/emergent results were called by telephone at the time of interpretation on 10/21/2017 at 6:03 pm to Dr. Nita Sickle , who verbally acknowledged these results. Electronically Signed   By: Amie Portland M.D.   On: 10/21/2017 18:00   Dg Shoulder Left  Result Date: 10/21/2017 CLINICAL DATA:  Left shoulder pain and bruising after fall today. EXAM: LEFT SHOULDER - 2+ VIEW COMPARISON:  None. FINDINGS: There is mild displaced fracture of the lateral left third rib. Degenerative joint changes of the left shoulder are noted. IMPRESSION: Fracture of the lateral left third rib. Electronically Signed   By: Sherian Rein M.D.   On: 10/21/2017 18:40     IMPRESSION AND PLAN:   *Subarachnoid hemorrhage with significant neurological decline.  At this time patient will be admitted to medical floor with comfort being priority.  Family has requested IV fluids to see if there would be any improvement till tomorrow morning.  Consult palliative care.  Advised son and daughter at bedside that if he has no significant change by tomorrow morning he would be a candidate for hospice home. Pain and anxiety medications as needed  *Hypertension.  Blood pressure has normalized at this time.  Monitor.  All the records are reviewed and case discussed with ED provider. Management plans discussed with the patient, family and they are in agreement.  CODE STATUS: DO NOT RESUSCITATE and DO NOT INTUBATE  TOTAL TIME TAKING CARE OF THIS PATIENT: 35 minutes.   Molinda Bailiff Danicia Terhaar M.D on 10/21/2017 at 7:19 PM  Between 7am to 6pm - Pager - [phone]  After 6pm go to [email or web address] - password [email or web address] [facility]  [facility]  Office  [phone]  CC: Primary care physician; Marguarite Arbour, MD  Note: This dictation was prepared with Dragon dictation along with smaller phrase technology. Any  transcriptional errors that result from this process are unintentional.

## 2017-10-21 NOTE — ED Provider Notes (Signed)
Eric [name]  ____________________________________________  Time seen: Approximately 5:04 PM  I have reviewed the triage vital signs and the nursing notes.   HISTORY  Chief Complaint Fall  Level 5 caveat:  Portions of the history and physical were unable to be obtained due to non verbal   HPI Eric [name] is a 84 y.o. male with a history of Alzheimer's dementia, traumatic subdural hematoma, CAD, hypertension, hyperlipidemia, BPH who presents for evaluation of unwitnessed fall.  Patient was found on the floor of his room.  Estimated downtime of 10 minutes.  Patient sustained laceration of his left parietal scalp.  Patient nonverbal.  According to nursing home patient is usually verbal at baseline and he was walking earlier today talking about the ward that he had been on.  Past Medical History:  Diagnosis Date  . Arteriosclerotic cardiovascular disease    status post MI with PTCA and stent placement 2012  . BPH (benign prostatic hyperplasia)   . CAD (coronary artery disease)   . COPD (chronic obstructive pulmonary disease) (HCC)   . Elevated PSA   . Fracture of left femur (HCC)   . GERD (gastroesophageal reflux disease)   . Heart attack (HCC)   . Hematuria   . HLD (hyperlipidemia)   . Hyperlipidemia, unspecified   . Hypertension   . Osteoporosis   . Urinary retention     Patient Active Problem List   Diagnosis Date Noted  . Abnormal weight loss 02/12/2017  . Hypotension 02/12/2017  . Subdural hematoma (HCC) 02/10/2017  . Alzheimer's disease with late onset (HCC) 02/10/2017  . History of urinary retention 01/10/2015  . BPH with obstruction/lower urinary tract symptoms 01/10/2015  . Coronary artery disease 01/07/2011  . Dyslipidemia 01/07/2011  . S/P hip replacement 01/07/2011    Past Surgical History:  Procedure Laterality Date  . APPENDECTOMY    . Arm surgery Right   . CARDIAC CATHETERIZATION    .  carotid surgery stent    . CORONARY ANGIOPLASTY    . ELBOW SURGERY Right   . HERNIA REPAIR     right inguinal   . HIP SURGERY Left 2006   left hip replacement    Prior to Admission medications   Medication Sig Start Date End Date Taking? Authorizing Provider  aspirin 81 MG chewable tablet Chew 81 mg by mouth daily.   Yes [provider]  acetaminophen (TYLENOL) 325 MG tablet Take 975 mg by mouth every 6 (six) hours as needed.     [provider]  Amino Acids-Protein Hydrolys (FEEDING SUPPLEMENT, PRO-STAT SUGAR FREE 64,) LIQD Take 30 mLs by mouth 2 (two) times daily between meals.    [provider]  atorvastatin (LIPITOR) 20 MG tablet Take 20 mg by mouth daily.    [provider]  finasteride (PROSCAR) 5 MG tablet Take 1 tablet (5 mg total) by mouth daily. 01/09/17   Michiel Cowboy A, PA-C  metoprolol succinate (TOPROL-XL) 25 MG 24 hr tablet Take 12.5 mg by mouth 2 (two) times daily.     [provider]  modafinil (PROVIGIL) 100 MG tablet Take 1 tablet (100 mg total) by mouth daily. 02/19/17   Lorenso Quarry, NP  nitroGLYCERIN (NITROSTAT) 0.4 MG SL tablet Place 0.4 mg under the tongue every 5 (five) minutes as needed for chest pain.     [provider]  polyethylene glycol (MIRALAX / GLYCOLAX) packet Take 17 g by mouth daily.    [provider]  senna-docusate (SENNA S) 8.6-50 MG tablet Take 2 tablets by mouth 2 (two) times daily.    [provider]    Allergies Eggs or egg-derived products  Family History  Problem Relation Age of Onset  . Stroke Mother   . Arthritis Mother   . Heart attack Father   . Diabetes Father   . Stroke Brother   . Prostate cancer Neg Hx   . Bladder Cancer Neg Hx     Social History Social History   Tobacco Use  . Smoking status: Current Every Day Smoker    Types: Pipe  . Smokeless tobacco: Never Used  Substance Use Topics  . Alcohol use: No    Alcohol/week: 0.0 standard  drinks  . Drug use: No    Review of Systems Constitutional: Negative for fever. Eyes: Negative for visual changes. ENT: Negative for facial injury or neck injury Cardiovascular: Negative for chest injury. Respiratory: Negative for shortness of breath. Negative for chest wall injury. Gastrointestinal: Negative for abdominal pain or injury. Genitourinary: Negative for dysuria. Musculoskeletal: Negative for back injury, negative for arm or leg pain. Skin: + scalp laceration Neurological: + head injury.   ____________________________________________   PHYSICAL EXAM:  VITAL SIGNS:  Vitals:   10/21/17 1810 10/21/17 1825  BP: (!) 193/100 (!) 186/100  Pulse: 83 78  Resp: (!) 21 (!) 21  SpO2: 97% 97%    Full spinal precautions maintained throughout the trauma exam. Constitutional: Awake, non verbal, no distress. HEENT Head: Normocephalic and atraumatic. L parietal laceration Face: No facial bony tenderness. Stable midface Ears: No hemotympanum bilaterally. No Battle sign Eyes: No eye injury. PERRL. No raccoon eyes Nose: Nontender. No epistaxis. No rhinorrhea Mouth/Throat: Mucous membranes are moist. No oropharyngeal blood. No dental injury. Airway patent without stridor. Normal voice. Neck: no C-collar in place. No midline c-spine tenderness.  Cardiovascular: Normal rate, regular rhythm. Normal and symmetric distal pulses are present in all extremities. Pulmonary/Chest: Chest wall is stable and nontender to palpation/compression. Normal respiratory effort. Breath sounds are normal. No crepitus.  Abdominal: Soft, nontender, non distended. Musculoskeletal: Diffuse muscle stiffness. Nontender with normal full range of motion in all extremities. Bruising of the L shoulder. No deformities. No thoracic or lumbar midline spinal tenderness. Pelvis is stable. Skin: Skin is warm, dry and intact. No abrasions or contutions. Neurological: Moves all extremities spontaneously, face is  symmetric  Glascow Coma Score: 1 - Opens eyes on own 5 - Pushes away noxious stimulus 1 - Makes no noise GCS: 7    ____________________________________________   LABS (all labs ordered are listed, but only abnormal results are displayed)  Labs Reviewed  CBC WITH DIFFERENTIAL/PLATELET - Abnormal; Notable for the following components:      Result Value   RBC 4.23 (*)    HCT 38.3 (*)    All other components within normal limits  BASIC METABOLIC PANEL - Abnormal; Notable for the following components:   Potassium 5.4 (*)    Glucose, Bld 110 (*)    Calcium 8.6 (*)    GFR calc non Af Amer 52 (*)    GFR calc Af Amer 60 (*)    All other components within normal limits  URINALYSIS, COMPLETE (UACMP) WITH MICROSCOPIC - Abnormal; Notable for the following components:   Color, Urine YELLOW (*)    APPearance CLOUDY (*)    Leukocytes, UA LARGE (*)    WBC, UA >50 (*)    Bacteria, UA RARE (*)    All other components  within normal limits  URINE CULTURE  APTT  PROTIME-INR   ____________________________________________  EKG  ED ECG REPORT I, Nita Sickle, the attending physician, personally viewed and interpreted this ECG.  Normal sinus rhythm, rate of 75, first-degree AV block, normal QRS and QTc intervals, right axis deviation, no ST elevations or depressions.  No significant changes when compared to prior from 2018. ____________________________________________  RADIOLOGY  I have personally reviewed the images performed during this visit and I agree with the Radiologist's read.   Interpretation by Radiologist:  Ct Head Wo Contrast  Result Date: 10/21/2017 CLINICAL DATA:  Pt had an unwitenssed fall at [employer]. Pt has injury (hematoma, bloody) to left side of head. No other injuries noted. Pt is not verbal at this time. Responsive to tactile stimuli. Pt ambulatory and verbal at baseline per ems report. EXAM: CT HEAD WITHOUT CONTRAST CT CERVICAL SPINE WITHOUT CONTRAST  TECHNIQUE: Multidetector CT imaging of the head and cervical spine was performed following the standard protocol without intravenous contrast. Multiplanar CT image reconstructions of the cervical spine were also generated. COMPARISON:  02/19/2017 FINDINGS: CT HEAD FINDINGS Brain: There is acute subarachnoid hemorrhage along the quadrigeminal plate cistern. There is a small area of parenchymal contusion along the inferolateral right temporal lobe. No other acute intracranial hemorrhage. Ventricles normal in configuration. There is ventricular and sulcal enlargement reflecting mild generalized atrophy. This is stable. No convincing hydrocephalus. No evidence of an ischemic infarct. Vascular: No hyperdense vessel or unexpected calcification. Skull: Normal. Negative for fracture or focal lesion. Sinuses/Orbits: Globes and orbits are unremarkable. Dependent fluid is seen in the left maxillary sinus, posterior left ethmoid air cells and right sphenoid sinus. Mild left ethmoid sinus mucosal thickening. Other: Left scalp hematoma. CT CERVICAL SPINE FINDINGS Alignment: Normal. Skull base and vertebrae: No acute fracture. No primary bone lesion or focal pathologic process. Soft tissues and spinal canal: No prevertebral fluid or swelling. No visible canal hematoma. Disc levels: There is biconcave depressions of vertebral endplates throughout the cervical spine, which appears degenerative in origin. Degenerative facet disease noted bilaterally greatest on the right at C4-C5 and left C5-C6. No convincing disc herniation. Skeletal structures are diffusely demineralized Upper chest: No soft tissue masses or adenopathy. Lung apices demonstrate pleuroparenchymal scarring mild bronchiectasis and areas of bronchial mucous plugging at the right apex. No convincing acute finding. Other: None. IMPRESSION: HEAD CT 1. Small amount of acute subarachnoid hemorrhage is noted in the quadrigeminal plate cistern at and just below the tentorial  incisura. 2. Small area of parenchymal contusion along the inferolateral right temporal lobe. 3. No other acute intracranial abnormality. 4. Left scalp hematoma.  No skull fracture. CERVICAL CT 1. No fracture or acute finding. Critical Value/emergent results were called by telephone at the time of interpretation on 10/21/2017 at 6:03 pm to Dr. Nita Sickle , who verbally acknowledged these results. Electronically Signed   By: Amie Portland M.D.   On: 10/21/2017 18:00   Ct Cervical Spine Wo Contrast  Result Date: 10/21/2017 CLINICAL DATA:  Pt had an unwitenssed fall at [employer]. Pt has injury (hematoma, bloody) to left side of head. No other injuries noted. Pt is not verbal at this time. Responsive to tactile stimuli. Pt ambulatory and verbal at baseline per ems report. EXAM: CT HEAD WITHOUT CONTRAST CT CERVICAL SPINE WITHOUT CONTRAST TECHNIQUE: Multidetector CT imaging of the head and cervical spine was performed following the standard protocol without intravenous contrast. Multiplanar CT image reconstructions of the cervical spine were also generated.  COMPARISON:  02/19/2017 FINDINGS: CT HEAD FINDINGS Brain: There is acute subarachnoid hemorrhage along the quadrigeminal plate cistern. There is a small area of parenchymal contusion along the inferolateral right temporal lobe. No other acute intracranial hemorrhage. Ventricles normal in configuration. There is ventricular and sulcal enlargement reflecting mild generalized atrophy. This is stable. No convincing hydrocephalus. No evidence of an ischemic infarct. Vascular: No hyperdense vessel or unexpected calcification. Skull: Normal. Negative for fracture or focal lesion. Sinuses/Orbits: Globes and orbits are unremarkable. Dependent fluid is seen in the left maxillary sinus, posterior left ethmoid air cells and right sphenoid sinus. Mild left ethmoid sinus mucosal thickening. Other: Left scalp hematoma. CT CERVICAL SPINE FINDINGS Alignment: Normal.  Skull base and vertebrae: No acute fracture. No primary bone lesion or focal pathologic process. Soft tissues and spinal canal: No prevertebral fluid or swelling. No visible canal hematoma. Disc levels: There is biconcave depressions of vertebral endplates throughout the cervical spine, which appears degenerative in origin. Degenerative facet disease noted bilaterally greatest on the right at C4-C5 and left C5-C6. No convincing disc herniation. Skeletal structures are diffusely demineralized Upper chest: No soft tissue masses or adenopathy. Lung apices demonstrate pleuroparenchymal scarring mild bronchiectasis and areas of bronchial mucous plugging at the right apex. No convincing acute finding. Other: None. IMPRESSION: HEAD CT 1. Small amount of acute subarachnoid hemorrhage is noted in the quadrigeminal plate cistern at and just below the tentorial incisura. 2. Small area of parenchymal contusion along the inferolateral right temporal lobe. 3. No other acute intracranial abnormality. 4. Left scalp hematoma.  No skull fracture. CERVICAL CT 1. No fracture or acute finding. Critical Value/emergent results were called by telephone at the time of interpretation on 10/21/2017 at 6:03 pm to Dr. Nita Sickle , who verbally acknowledged these results. Electronically Signed   By: Amie Portland M.D.   On: 10/21/2017 18:00      ____________________________________________   PROCEDURES  Procedure(s) performed: None Procedures Critical Care performed:  None ____________________________________________   INITIAL IMPRESSION / ASSESSMENT AND PLAN / ED COURSE  84 y.o. male with a history of Alzheimer's dementia, traumatic subdural hematoma, CAD, hypertension, hyperlipidemia, BPH who presents for evaluation of unwitnessed fall.  Patient with a laceration and bruising of the left parietal scalp and bruising of the left shoulder.  GCS 7 on arrival. DNR/DNI confirmed with family. No other obvious signs of trauma on  physical exam.  Tetanus shot is not up-to-date will renew.  Will repair laceration with staples.  CT head and cervical spine to rule out traumatic injury, x-ray of the left shoulder to rule out fracture.  Since fall was unwitnessed and patient is unable to provide any history we will check basic labs and EKG to rule out AKI, anemia, electrolyte abnormalities, dysrhythmias or ischemia as possible etiologies of patient's fall.  Clinical Course as of Oct 21 1837  Tue Oct 21, 2017  1815 CT concerning for subarachnoid hemorrhage.  Patient's GCS remains 7.  Patient's son and daughter who is his POA are at the bedside.  They tell me the patient is currently a hospice patient on palliative care for end-stage dementia.  They do not wish any aggressive measurements and just want patient to be comfortable and pain-free.  They confirm the patient is DNR/DNI. Will consult NSG. Will give fentanyl for pain. HOB elevated at 45 degrees. Will start nicardipine for elevated BP.    [CV]  1837 Discussed with Dr. Riley Nearing who agrees with admission here for pain control and BP control  and does not recommend repeat imaging if family wishes no intervention. Family aware that if patient decompensates no procedures can be done at [facility].  Will discuss with the hospitalist for admission.   [CV]    Clinical Course User Index [CV] Don Perking [place], MD     As part of my medical decision making, I reviewed the following data within the electronic MEDICAL RECORD NUMBER History obtained from family, Nursing notes reviewed and incorporated, Labs reviewed , EKG interpreted , Old chart reviewed, Radiograph reviewed , Discussed with admitting physician , A consult was requested and obtained from this/these consultant(s) Neurosurgery, Notes from prior ED visits and [place] Controlled Substance Database    Pertinent labs & imaging results that were available during my care of the patient were reviewed by me and considered in my medical  decision making (see chart for details).    ____________________________________________   FINAL CLINICAL IMPRESSION(S) / ED DIAGNOSES  Final diagnoses:  Fall, initial encounter  Subarachnoid hemorrhage following injury, no loss of consciousness, initial encounter (HCC)      NEW MEDICATIONS STARTED DURING THIS VISIT:  ED Discharge Orders    None       [name]:  This document was prepared using Dragon voice recognition software and may include unintentional dictation errors.    Nita Sickle, MD 10/21/17 [phone]

## 2017-10-21 NOTE — ED Notes (Signed)
Abrasion on head cleaned

## 2017-10-21 NOTE — ED Notes (Signed)
Call to floor.  RN to call me back

## 2017-10-21 NOTE — Consult Note (Signed)
NEUROSURGERY INPATIENT CONSULT  PLUMMER MATICH 1923/05/21 [id number] 10/21/2017 0  Date of Service:  10/21/2017  Patient Care Team: Marguarite Arbour, MD as PCP - General (Internal Medicine)  Consult Requested by:  Nita Sickle, MD  Chief Complaint: s/p fall with traumatic SAH  History of Present Illness: 84 year old gentleman with previous medical history significant for dementia, fell today striking his head.  He was transferred to [facility] for further evaluation and management.  Head CT was performed demonstrating traumatic subarachnoid hemorrhage.  Neurosurgery is consulted for recommendations.  The patient suffered a fall on Christmas Day 2018 resulting in a subdural hematoma.  At that time, the family opted to have no surgical intervention and the patient ultimately recovered.  After this episode, the family has made the patient DO NOT RESUSCITATE/DO NOT INTUBATE.  They want no heroic measures performed.  They are not interested in any surgical intervention as they understand that he would not survive a cranial procedure given his frail status.  They are hopeful that he can be supported medically and that he will improve and be able to return to his care facility.  They shared that he down to 82 pounds and on a pureed diet for the past several months.   Past Medical History  Patient Active Problem List   Diagnosis Date Noted  . Subarachnoid bleed (HCC) 10/21/2017  . Abnormal weight loss 02/12/2017  . Hypotension 02/12/2017  . Subdural hematoma (HCC) 02/10/2017  . Alzheimer's disease with late onset (HCC) 02/10/2017  . History of urinary retention 01/10/2015  . BPH with obstruction/lower urinary tract symptoms 01/10/2015  . Coronary artery disease 01/07/2011  . Dyslipidemia 01/07/2011  . S/P hip replacement 01/07/2011    Past Medical History:  Diagnosis Date  . Arteriosclerotic cardiovascular disease    status post MI with PTCA and stent placement  2012  . BPH (benign prostatic hyperplasia)   . CAD (coronary artery disease)   . COPD (chronic obstructive pulmonary disease) (HCC)   . Elevated PSA   . Fracture of left femur (HCC)   . GERD (gastroesophageal reflux disease)   . Heart attack (HCC)   . Hematuria   . HLD (hyperlipidemia)   . Hyperlipidemia, unspecified   . Hypertension   . Osteoporosis   . Urinary retention      Past Surgical History:  Procedure Laterality Date  . APPENDECTOMY    . Arm surgery Right   . CARDIAC CATHETERIZATION    . carotid surgery stent    . CORONARY ANGIOPLASTY    . ELBOW SURGERY Right   . HERNIA REPAIR     right inguinal   . HIP SURGERY Left 2006   left hip replacement      Allergies  Allergen Reactions  . Eggs Or Egg-Derived Products Nausea And Vomiting     Social History   Tobacco Use  . Smoking status: Current Every Day Smoker    Types: Pipe  . Smokeless tobacco: Never Used  Substance Use Topics  . Alcohol use: No    Alcohol/week: 0.0 standard drinks     Family History  Problem Relation Age of Onset  . Stroke Mother   . Arthritis Mother   . Heart attack Father   . Diabetes Father   . Stroke Brother   . Prostate cancer Neg Hx   . Bladder Cancer Neg Hx    Prior to Admission medications   Medication Sig Start Date End Date Taking? Authorizing Provider  acetaminophen (  TYLENOL) 325 MG tablet Take 650 mg by mouth every 4 (four) hours as needed for mild pain or moderate pain.    Yes [provider]  acetaminophen (TYLENOL) 500 MG tablet Take 500 mg by mouth every 6 (six) hours as needed for fever.   Yes [provider]  alum & mag hydroxide-simeth (MINTOX REGULAR STRENGTH) 200-200-20 MG/5ML suspension Take 30 mLs by mouth every 6 (six) hours as needed for indigestion or heartburn (max 4 doses daily).   Yes [provider]  Amino Acids-Protein Hydrolys (FEEDING SUPPLEMENT, PRO-STAT SUGAR FREE 64,) LIQD Take 30 mLs by mouth 3 (three) times  daily.    Yes [provider]  aspirin 81 MG chewable tablet Chew 81 mg by mouth daily.   Yes [provider]  atorvastatin (LIPITOR) 20 MG tablet Take 20 mg by mouth at bedtime.    Yes [provider]  finasteride (PROSCAR) 5 MG tablet Take 1 tablet (5 mg total) by mouth daily. 01/09/17  Yes McGowan, Carollee Herter A, PA-C  guaiFENesin (ROBITUSSIN) 100 MG/5ML SOLN Take 10 mLs by mouth every 6 (six) hours as needed for cough or to loosen phlegm (max 4 doses daily).   Yes [provider]  loperamide (IMODIUM) 2 MG capsule Take 2 mg by mouth as needed for diarrhea or loose stools (max 8 doses daily).   Yes [provider]  magnesium hydroxide (MILK OF MAGNESIA) 400 MG/5ML suspension Take 30 mLs by mouth at bedtime as needed for mild constipation.   Yes [provider]  metoprolol succinate (TOPROL-XL) 25 MG 24 hr tablet Take 12.5 mg by mouth 2 (two) times daily.    Yes [provider]  neomycin-bacitracin-polymyxin (NEOSPORIN) OINT Apply 1 application topically 4 (four) times daily as needed for wound care.   Yes [provider]  nitroGLYCERIN (NITROSTAT) 0.4 MG SL tablet Place 0.4 mg under the tongue every 5 (five) minutes as needed for chest pain.    Yes [provider]  polyethylene glycol (MIRALAX / GLYCOLAX) packet Take 17 g by mouth daily.   Yes [provider]  senna-docusate (SENNA S) 8.6-50 MG tablet Take 2 tablets by mouth 2 (two) times daily.   Yes [provider]  modafinil (PROVIGIL) 100 MG tablet Take 1 tablet (100 mg total) by mouth daily. Patient not taking: Reported on 10/21/2017 02/19/17   Lorenso Quarry, NP     ROS: Unobtainable  EXAMINATION  VITALS: BP 140/84   Pulse (!) 106   Resp 18   SpO2 98%    Vitals Current Average / Min / Max     Temp         No data recorded     BP     140/84     BP  Min: 101/57  Max: 193/100     HR    (!) 106    Pulse  Avg: 87.1   Min: 75  Max: 106     RR    18    Resp  Avg: 17.3  Min: 13  Max: 21     Sats    98 %    SpO2  Min: 89 %  Max: 98 %     Weight         Admit:      GENERAL: frail; cachectic; edentulous   MUSCULOSKELETAL:   No abnormal movements or fasiculations.   HIGHER INTEGRATIVE FUNCTIONS:  GCS: 8 E-2, V-1, M-5   CRANIAL NERVES: Pupils are  pinpoint and minimally reactive Corneal reflex intact Blinks to threat   STRENGTH:  Moves all extremities in response to painful stimulus   REFLEXES: No clonus; toes mute   LABORATORY: Lab Results  Component Value Date   NA 136 10/21/2017   K 5.4 (H) 10/21/2017   CL 98 10/21/2017   MG 1.9 02/11/2017   WBC 8.2 10/21/2017   HGB 13.0 10/21/2017   HCT 38.3 (L) 10/21/2017   INR 1.04 10/21/2017     IMAGING:  I personally reviewed the patient's head CT with his son and daughter.  This demonstrates a small amount of traumatic subarachnoid hemorrhage in the quadrigeminal cistern.  There is also a contusion in the posterior lateral right temporal lobe.  No evidence of fracture.  There is a cephalohematoma in the left frontal area.  IMPRESSION: HEAD CT  1. Small amount of acute subarachnoid hemorrhage is noted in the quadrigeminal plate cistern at and just below the tentorial incisura. 2. Small area of parenchymal contusion along the inferolateral right temporal lobe. 3. No other acute intracranial abnormality. 4. Left scalp hematoma.  No skull fracture.  CERVICAL CT 1. No fracture or acute finding.  Critical Value/emergent results were called by telephone at the time of interpretation on 10/21/2017 at 6:03 pm to Dr. Nita Sickle , who verbally acknowledged these results.  ASSESSMENT AND PLAN:   84 year old gentleman with severe dementia, DNR/DNI, status post fall with traumatic subarachnoid hemorrhage settling in the quadrigeminal cistern.  I had an extensive discussion with the patient's son and daughter.  They are not interested in  any neurosurgical intervention under any circumstance even if their father should deteriorate from progression of his intracranial injury.  I agree with this.  They understand that given their father's frail condition, neurosurgical intervention would not be survived by their father.  They would like to have their father treated medically in the hopes that he will slowly recover and be able to return to his care facility.  They understand that he may not recover and may need to transition to comfort measures.  1.  Recommend supportive cares and medical treatment as indicated.   2.  Could consider keppra 500mg  bid x7days for short term seizure prophylaxis however this can be associated with cognitive side effects particularly in the elderly.  Would hold unless seizure activity is suspected. 3.  Recommend HOB >30degrees. 4.  No indications for further Head CT unless patient fails to improve or develops progressive neurologic decline/deficits.  A Head CT in this instance could inform management goals if it showed worsening of the patient's intracranial hemorrhage. 5.  No neurosurgical interventions recommended regardless if patient suffers decline or worsening of hemorrhage.  In this instance, would pursue comfort care. 6.  Recommend Palliative Care consult. 7.  Neurosurgery will sign off; please call with questions.  ACTIVE PROBLEMS: Active Problems:   Subarachnoid bleed (HCC)  Of the 30 minutes I spent with the patient with family at the bedside, greater than 50% of this time was spent discussing the patient's complex neurologic system disease.  This included discussion of how diagnosis is made, review of pertinent imaging studies and examination findings, as well as scientific basis for the differential diagnosis.  In addition, the family was counseled about medical and surgical options for treatment.  Included in this discussion were the indications for treatment, alternative treatments available,  likelihood of success, risks associated with all treatment options, and long-term prognosis.  All questions were answered, and the family expressed  understanding of the prescribed plan.  No barriers to comprehension.  This note was dictated using voice recognition software.  Please contact me if there are any questions regarding its content.   Erskine Emery, MD Neurosurgery  This note was dictated using voice recognition software.  Please contact me if there are any questions regarding its content.

## 2017-10-21 NOTE — Progress Notes (Addendum)
Advance care planning  Purpose of Encounter Subarachnoid hemorrhage, CODE STATUS discussion, goals of care  Parties in Attendance Son and daughter at bedside who are joint healthcare power of attorney's  Patients Decisional capacity Patient is lethargic.  With dementia unable to make medical decisions.  The patient is a resident of memory care unit and spends most of his time in bed and wheelchair.  Minimal ambulation.  Has had multiple falls due to his gait abnormalities.  Had subdural hematoma in the past where he was transferred to [facility] and family refused any aggressive surgeries. He had significant change in his memory and swallowing after his initial bleed.  Daughter is concerned that he would have significant decline after this episode of subarachnoid hemorrhage. Son and daughter understand that patient is critically ill but do not want any transfer or repeat imaging or neurosurgeries.  They are requesting conservative management for comfort.  I have explained the extremely poor prognosis in this case and that hospice home would be an option if he does not wake up and is unable to meet nutritional needs.  They are requesting me start IV fluids for tonight to see if patient would improve.  Also requesting we have pain medications if patient needs them.  They want comfort to be a priority but are hopeful that he would make a recovery.  Not resuscitate and not intubate.  Time spent - 47 minutes

## 2017-10-21 NOTE — ED Triage Notes (Signed)
Pt had an unwitenssed fall at [employer].  Pt has injury (hematoma, bloody) to left side of head.  No other injuries noted.  Pt is not verbal at this time.  Responsive to tactile stimuli.  Pt ambulatory and verbal at baseline per ems report.

## 2017-10-21 NOTE — ED Notes (Signed)
Await call from floor to give report

## 2017-10-21 NOTE — ED Notes (Signed)
Call back to floor.  They state that they will call me back to take report.

## 2017-10-22 ENCOUNTER — Other Ambulatory Visit: Payer: Self-pay

## 2017-10-22 DIAGNOSIS — Z515 Encounter for palliative care: Secondary | ICD-10-CM

## 2017-10-22 DIAGNOSIS — W19XXXA Unspecified fall, initial encounter: Secondary | ICD-10-CM

## 2017-10-22 DIAGNOSIS — S066X0A Traumatic subarachnoid hemorrhage without loss of consciousness, initial encounter: Principal | ICD-10-CM

## 2017-10-22 DIAGNOSIS — Z66 Do not resuscitate: Secondary | ICD-10-CM

## 2017-10-22 DIAGNOSIS — Z7189 Other specified counseling: Secondary | ICD-10-CM

## 2017-10-22 LAB — MRSA PCR SCREENING: MRSA BY PCR: NEGATIVE

## 2017-10-22 MED ORDER — MORPHINE SULFATE (CONCENTRATE) 20 MG/ML PO SOLN: 10.0000 mg | ORAL | 0 refills | Status: AC | PRN

## 2017-10-22 MED ORDER — LORAZEPAM 0.5 MG PO TABS: 0.5000 mg | ORAL_TABLET | Freq: Three times a day (TID) | ORAL | 0 refills | Status: AC | PRN

## 2017-10-22 NOTE — Progress Notes (Signed)
Visit to current hospice patient who was admitted last night for subarachnoid hemorrhage post fall.  Patient is lying in the bed with no response to verbal or light tactile stimulation.  Patient is breathing at rate of 18 with no labor noted-  All extremities at this time are relaxed.  No use of accessory muscles.  Spoke with the patient's daughter regarding patient's current status and neuro exam.  She understands her father will likely not make any meaningful recovery.  Palliative care at the bedside doing consult upon my arrival and assessment.  We both discussed hospice home and goals of care.  Daughter, Raynelle Fanning,  endorses wanting to focus on comfort and she and her siblings are in agreement with this approach, however, they want to watch the patient today and see if he makes any changes and tries to wake up.  If not, we will plan on transferring him to the hospice home tomorrow morning for end of life care and full comfort measures.  I did discuss with her the possibility of taking Mr. Perrott back to [facility] for end of life care.  She was not interested in this option, and wants him to go to the hospice home if there is no improvement in his current status.  Will continue to follow.  Report to hospice team.  Norris Cross RN [facility]

## 2017-10-22 NOTE — Plan of Care (Signed)
  Problem: Health Behavior/Discharge Planning: Goal: Ability to manage health-related needs will improve Outcome: Progressing   Problem: Coping: Goal: Level of anxiety will decrease Outcome: Progressing   Problem: Pain Managment: Goal: General experience of comfort will improve Outcome: Progressing   Problem: Safety: Goal: Ability to remain free from injury will improve Outcome: Progressing   

## 2017-10-22 NOTE — Consult Note (Signed)
Consultation Note Date: 10/22/2017   Patient Name: Eric [name]  DOB: 10-31-1923  MRN: [id number]  Age / Sex: 84 y.o., male  PCP: Idelle Crouch, MD Referring Physician: Gladstone Lighter, MD  Reason for Consultation: Establishing goals of care  HPI/Patient Profile: 84 y.o. male admitted on 10/21/2017 from [facility] after having an unwitnessed fall from his wheelchair. He has a past medical history significant for worsening dementia, subdural bleed, COPD, hypertension, CAD, MI, subdural bleed r/t previous falls, and GERD. Family was at bedside and provided medical history. Patient was noted to be drowsy and unable to provide history. During his ED course CT of head showed subarachnoid bleed and his GCS was 7. Neurosurgery was consulted and family expressed wishes not to have any forms of aggressive measures or repeat imaging. Patient is a current hospice patient. Family requested for comfort and to allow patient to be observed overnight for any signs of improvement. Since admission patient has shown no signs of improvement. Continues to be unresponsive. Palliative Medicine team consulted for goals of care and evaluation for hospice home versus hospice/comfort care back at [facility] facility.   Clinical Assessment and Goals of Care: I have reviewed medical records including lab results, imaging, Epic notes, and MAR, received report from the bedside RN, and assessed the patient. I then met at the bedside with with  [name], patient's daughter/POA to discuss diagnosis prognosis, GOC, EOL wishes, disposition and options. Patient remains unresponsive. He has history of dementia also. He is unable to engage in goals of care discussion.   Daughter reports that patient is a current hospice patient with [facility] at his facility.   We discussed a brief life review of the patient. 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 His wife passed away in Apr 02, 2013 and they had 4 children. He served in [employer] for 4 years and served in [employer]. He was recently honored by [facility] and Palliative care as a English as a second language teacher.   As far as functional and nutritional status daughter reports patient's appetite has wax and waned over the past 3-6 months. Due to aspiration he was placed on a puree and nectar thick diet. She reports he was 117lb 5 months ago an now weighs about 87lb. He is generally wheelchair bound throughout the day and was able to stand, turn, and pivot with 2 person assist to the chair. She reports that patient has experienced frequent falls over the past year all resulting in subdural bleeds. She states hospice placed a chair and bed alarm for patient's safety.   We discussed his current illness and what it means in the larger context of his on-going co-morbidities.  Natural disease trajectory and expectations at EOL were discussed. Daughter verbalizes awareness and understanding of patient's current condition. She reports that all of her siblings are also aware and are accepting of the fact that patient will most likely require EOL care. They  do not want him to suffer but to only be kept comfortable. She states she feels this would be best done at the hospice home versus returning to the memory care unit amongst other residents.   I attempted to elicit values and goals of care important to the patient and his family.   The difference between aggressive medical intervention and comfort care was considered in light of the patient's goals of care. Daughter verbalizes her awareness of comfort care measures and at this time that is what she would like. She would like for IV fluids to be discontinued and after talking with her siblings they initially wanted to wait to see if he would show signs of improvement, they are now  prepared for him to go to hospice home for EOL care.   [name] confirms that patient is DNR/DNR. Marcene Brawn, RN [facility]) also visited during conversations and discussed care at hospice home and bed availability. Once bed availability is confirmed the goal is for patient to be transferred to hospice home of [place] at family's request.   Questions and concerns were addressed. The family was encouraged to call with questions or concerns.  PMT will continue to support holistically.  HCPOA-Julie Guffey (daughter)    SUMMARY OF RECOMMENDATIONS    DNR/DNI-as confirmed by daughter [name] [facility])  FULL COMFORT MEASURES-requested for IV fluids to be discontinued and patient to be discharged to [place] home if bed is available. Request to keep condom cath in place at discharge.   Ativan PRN for anxiety/seizure (as ordered)  Morphine PRN for pain, shortness of breath (as ordered)  Zofran PRN for nausea (as ordered)  PMT will continue to follow and support.   Code Status/Advance Care Planning:  DNR/DNI   Symptom Management:   Ativan PRN for anxiety/seizure (as ordered)  Morphine PRN for pain, shortness of breath (as ordered)  Zofran PRN for nausea (as ordered)  Palliative Prophylaxis:   Aspiration, Frequent Pain Assessment and Oral Care  Additional Recommendations (Limitations, Scope, Preferences):  Full Comfort Care-as requested by daughter  Psycho-social/Spiritual:   Desire for further Chaplaincy support:NO   Prognosis:   < 2 weeks-to days in the setting of subarachnoid bleed s/p fall, Alzheimer's, hypotension, unresponsive, BPH, CAD, weight loss >10% 117lb->87Lb.   Discharge Planning: Hospice facility      Primary Diagnoses: Present on Admission: **None**   I have reviewed the medical record, interviewed the patient and family, and examined the patient. The following aspects are pertinent.  Past Medical History:  Diagnosis Date  . Arteriosclerotic  cardiovascular disease    status post MI with PTCA and stent placement 2012  . BPH (benign prostatic hyperplasia)   . CAD (coronary artery disease)   . COPD (chronic obstructive pulmonary disease) ([place])   . Elevated PSA   . Fracture of left femur ([place])   . GERD (gastroesophageal reflux disease)   . Heart attack ([place])   . Hematuria   . HLD (hyperlipidemia)   . Hyperlipidemia, unspecified   . Hypertension   . Osteoporosis   . Urinary retention    Social History   Socioeconomic History  . Marital status: Widowed    Spouse name: Not on file  . Number of children: 4  . Years of education: 49  . Highest education level: 12th grade  Occupational History    Comment: retired Counselling psychologist  Social Needs  . Financial resource strain: Not on file  . Food insecurity:    Worry: Not on file  Inability: Not on file  . Transportation needs:    Medical: Not on file    Non-medical: Not on file  Tobacco Use  . Smoking status: Former Smoker    Types: Pipe    Last attempt to quit: 01/14/2017    Years since quitting: 0.7  . Smokeless tobacco: Never Used  Substance and Sexual Activity  . Alcohol use: No    Alcohol/week: 0.0 standard drinks  . Drug use: No  . Sexual activity: Not on file  Lifestyle  . Physical activity:    Days per week: Not on file    Minutes per session: Not on file  . Stress: Not on file  Relationships  . Social connections:    Talks on phone: Not on file    Gets together: Not on file    Attends religious service: Not on file    Active member of club or organization: Not on file    Attends meetings of clubs or organizations: Not on file    Relationship status: Not on file  Other Topics Concern  . Not on file  Social History Narrative   Former Nature conservation officer   Retired Counselling psychologist   Widowed   4 children   Currently smokes a pipe   Alcohol - none   Full Code   Family History  Problem Relation Age of Onset  . Stroke Mother   . Arthritis Mother     . Heart attack Father   . Diabetes Father   . Stroke Brother   . Prostate cancer Neg Hx   . Bladder Cancer Neg Hx    Scheduled Meds: . Tdap  0.5 mL Intramuscular Once   Continuous Infusions: . sodium chloride 50 mL/hr at 10/21/17 1915   PRN Meds:.acetaminophen **OR** acetaminophen, hydrALAZINE, LORazepam, morphine injection, ondansetron **OR** ondansetron (ZOFRAN) IV Medications Prior to Admission:  Prior to Admission medications   Medication Sig Start Date End Date Taking? Authorizing Provider  acetaminophen (TYLENOL) 325 MG tablet Take 650 mg by mouth every 4 (four) hours as needed for mild pain or moderate pain.    Yes [provider]  acetaminophen (TYLENOL) 500 MG tablet Take 500 mg by mouth every 6 (six) hours as needed for fever.   Yes [provider]  alum & mag hydroxide-simeth (MINTOX REGULAR STRENGTH) 262-035-59 MG/5ML suspension Take 30 mLs by mouth every 6 (six) hours as needed for indigestion or heartburn (max 4 doses daily).   Yes [provider]  Amino Acids-Protein Hydrolys (FEEDING SUPPLEMENT, PRO-STAT SUGAR FREE 64,) LIQD Take 30 mLs by mouth 3 (three) times daily.    Yes [provider]  aspirin 81 MG chewable tablet Chew 81 mg by mouth daily.   Yes [provider]  atorvastatin (LIPITOR) 20 MG tablet Take 20 mg by mouth at bedtime.    Yes [provider]  finasteride (PROSCAR) 5 MG tablet Take 1 tablet (5 mg total) by mouth daily. 01/09/17  Yes McGowan, Larene Beach A, PA-C  guaiFENesin (ROBITUSSIN) 100 MG/5ML SOLN Take 10 mLs by mouth every 6 (six) hours as needed for cough or to loosen phlegm (max 4 doses daily).   Yes [provider]  loperamide (IMODIUM) 2 MG capsule Take 2 mg by mouth as needed for diarrhea or loose stools (max 8 doses daily).   Yes [provider]  magnesium hydroxide (MILK OF MAGNESIA) 400 MG/5ML suspension Take 30 mLs by mouth at bedtime as needed for mild constipation.   Yes  [provider]  metoprolol succinate (TOPROL-XL) 25 MG 24 hr tablet Take 12.5 mg by mouth 2 (two) times daily.    Yes [provider]  neomycin-bacitracin-polymyxin (NEOSPORIN) OINT Apply 1 application topically 4 (four) times daily as needed for wound care.   Yes [provider]  nitroGLYCERIN (NITROSTAT) 0.4 MG SL tablet Place 0.4 mg under the tongue every 5 (five) minutes as needed for chest pain.    Yes [provider]  polyethylene glycol (MIRALAX / GLYCOLAX) packet Take 17 g by mouth daily.   Yes [provider]  senna-docusate (SENNA S) 8.6-50 MG tablet Take 2 tablets by mouth 2 (two) times daily.   Yes [provider]  LORazepam (ATIVAN) 0.5 MG tablet Take 1 tablet (0.5 mg total) by mouth every 8 (eight) hours as needed for anxiety. 10/22/17 10/22/18  Gladstone Lighter, MD  modafinil (PROVIGIL) 100 MG tablet Take 1 tablet (100 mg total) by mouth daily. Patient not taking: Reported on 10/21/2017 02/19/17   Toni Arthurs, NP  morphine (ROXANOL) 20 MG/ML concentrated solution Take 0.5 mLs (10 mg total) by mouth every 3 (three) hours as needed for severe pain or shortness of breath. 10/22/17   Gladstone Lighter, MD   Allergies  Allergen Reactions  . Eggs Or Egg-Derived Products Nausea And Vomiting   Review of Systems  Unable to perform ROS: Patient unresponsive    Physical Exam  Constitutional: He appears cachectic. He appears ill.  Thin and frail in appearance   Cardiovascular: Normal rate, regular rhythm and normal heart sounds. Exam reveals decreased pulses.  Pulmonary/Chest: Effort normal. He has decreased breath sounds.  Mouth breathing   Abdominal: Normal appearance. Bowel sounds are decreased.  Neurological: He is unresponsive. He displays atrophy.  unresponsive  Skin: Skin is warm and dry.  Psychiatric: He is noncommunicative.  Nursing note and vitals reviewed.   Vital Signs: BP 116/76 (BP Location: Right Arm)   Pulse  90   Temp 98.9 F (37.2 C) (Axillary)   Resp 20   Wt 42.5 kg   SpO2 100%   BMI 17.72 kg/m  Pain Scale: PAINAD   Pain Score: 0-No pain   SpO2: SpO2: 100 % O2 Device:SpO2: 100 % O2 Flow Rate: .O2 Flow Rate (L/min): 2 L/min  IO: Intake/output summary:   Intake/Output Summary (Last 24 hours) at 10/22/2017 1327 Last data filed at 10/22/2017 0600 Gross per 24 hour  Intake 655.83 ml  Output -  Net 655.83 ml    LBM: Last BM Date: (last BM [place]) Baseline Weight: Weight: 42.5 kg Most recent weight: Weight: 42.5 kg     Palliative Assessment/Data: PPS 10%   Time In: 1130 Time Out: 1245 Time Total: 75 min  Greater than 50%  of this time was spent counseling and coordinating care related to the above assessment and plan.  Signed by:  Alda Lea, AGPCNP-BC Palliative Medicine Team  Phone: [phone] Fax: [phone] Pager: [phone] Amion: Bjorn Pippin    Please contact Palliative Medicine Team phone at [phone] for questions and concerns.  For individual provider: See Shea Evans

## 2017-10-22 NOTE — [place] FL2 (Signed)
[place] MEDICAID FL2 LEVEL OF CARE SCREENING TOOL     IDENTIFICATION  Patient Name: Eric [name] Birthdate: 07-23-1923 Sex: male Admission Date (Current Location): 10/21/2017  [facility] and [place] Number:  Chiropodist and Address:  [facility], [address], [place], [place] [address]      Provider Number: [id number]  Attending Physician Name and Address:  Enid Baas, MD  Relative Name and Phone Number:       Current Level of Care: Hospital Recommended Level of Care: Memory Care Prior Approval Number:    Date Approved/Denied:   PASRR Number:    Discharge Plan: Other (Comment)(Memory Care )    Current Diagnoses: Patient Active Problem List   Diagnosis Date Noted  . Subarachnoid bleed (HCC) 10/21/2017  . Abnormal weight loss 02/12/2017  . Hypotension 02/12/2017  . Subdural hematoma (HCC) 02/10/2017  . Alzheimer's disease with late onset (HCC) 02/10/2017  . History of urinary retention 01/10/2015  . BPH with obstruction/lower urinary tract symptoms 01/10/2015  . Coronary artery disease 01/07/2011  . Dyslipidemia 01/07/2011  . S/P hip replacement 01/07/2011    Orientation RESPIRATION BLADDER Height & Weight     Self  O2(2 liters ) Incontinent Weight: 93 lb 12.8 oz (42.5 kg) Height:     BEHAVIORAL SYMPTOMS/MOOD NEUROLOGICAL BOWEL NUTRITION STATUS  (none) (none) Incontinent Diet(NPO )  AMBULATORY STATUS COMMUNICATION OF NEEDS Skin   Extensive Assist Verbally Normal                       Personal Care Assistance Level of Assistance  Bathing, Feeding, Dressing Bathing Assistance: Maximum assistance Feeding assistance: Limited assistance Dressing Assistance: Maximum assistance     Functional Limitations Info  Sight, Hearing, Speech Sight Info: Adequate Hearing Info: Adequate Speech Info: Adequate    SPECIAL CARE FACTORS FREQUENCY                       Contractures      Additional Factors Info   Code Status, Allergies Code Status Info: DNR Allergies Info: Eggs Or Egg-derived Products           Current Medications (10/22/2017):  This is the current hospital active medication list Current Facility-Administered Medications  Medication Dose Route Frequency Provider Last Rate Last Dose  . 0.9 %  sodium chloride infusion   Intravenous Continuous Milagros Loll, MD 50 mL/hr at 10/21/17 1915    . acetaminophen (TYLENOL) tablet 650 mg  650 mg Oral Q6H PRN Milagros Loll, MD       Or  . acetaminophen (TYLENOL) suppository 650 mg  650 mg Rectal Q6H PRN Sudini, Wardell Heath, MD      . hydrALAZINE (APRESOLINE) injection 10 mg  10 mg Intravenous Q6H PRN Sudini, Srikar, MD      . LORazepam (ATIVAN) injection 1 mg  1 mg Intravenous Q4H PRN Sudini, Srikar, MD      . morphine 2 MG/ML injection 1 mg  1 mg Intravenous Q3H PRN Sudini, Srikar, MD      . ondansetron (ZOFRAN) tablet 4 mg  4 mg Oral Q6H PRN Sudini, Srikar, MD       Or  . ondansetron (ZOFRAN) injection 4 mg  4 mg Intravenous Q6H PRN Sudini, Wardell Heath, MD      . Tdap (BOOSTRIX) injection 0.5 mL  0.5 mL Intramuscular Once Nita Sickle, MD         Discharge Medications: Please see discharge summary for a  list of discharge medications.  Relevant Imaging Results:  Relevant Lab Results:   Additional Information    Madicyn [place]ina  Rinaldo Ratel, [address]

## 2017-10-22 NOTE — Progress Notes (Signed)
Follow up with patient's daughter, Raynelle Fanning.  She has spoken with her siblings and all are in agreement with and now request transport to the hospice home for comfort and end of life care.  Will plan EMS pick up at 1600.  Arranged by this RN.  Report called to the hospice home admission line.  Discharge summary faxed.

## 2017-10-22 NOTE — Discharge Summary (Signed)
[employer] [facility] [place] at [facility]   PATIENT NAME: Eric [name]    MR#:  [id number]  DATE OF BIRTH:  09-02-1923  DATE OF ADMISSION:  10/21/2017   ADMITTING PHYSICIAN: Milagros Loll, MD  DATE OF DISCHARGE:  10/22/17  PRIMARY CARE PHYSICIAN: Marguarite Arbour, MD   ADMISSION DIAGNOSIS:   Subarachnoid hemorrhage following injury, no loss of consciousness, initial encounter (HCC) [S06.6X0A] Fall, initial encounter [W19.XXXA]  DISCHARGE DIAGNOSIS:   Active Problems:   Subarachnoid bleed (HCC)   SECONDARY DIAGNOSIS:   Past Medical History:  Diagnosis Date  . Arteriosclerotic cardiovascular disease    status post MI with PTCA and stent placement 2012  . BPH (benign prostatic hyperplasia)   . CAD (coronary artery disease)   . COPD (chronic obstructive pulmonary disease) (HCC)   . Elevated PSA   . Fracture of left femur (HCC)   . GERD (gastroesophageal reflux disease)   . Heart attack (HCC)   . Hematuria   . HLD (hyperlipidemia)   . Hyperlipidemia, unspecified   . Hypertension   . Osteoporosis   . Urinary retention     HOSPITAL COURSE:   84 year old male with past medical history significant for worsening dementia within the last year, prior history of subdural bleed received conservative management, hypertension, COPD who is from a memory care unit at an assisted living facility presents to hospital secondary to an unwitnessed fall.  1.  Acute subarachnoid hemorrhage, also intraparenchymal hemorrhage on CT head. -Patient's mental status has not improved at all since admission.  He is obtunded and barely responsive to deep tactile stimulation. -Patient already followed by palliative as outpatient. -Palliative care consult as inpatient -received IV fluids.  Barely has any urine output at this time.  Daughter at bedside.  Requesting for hospice home transfer. -We will discontinue fluids at this time.  Start on comfort meds.  Patient appears very  comfortable at this time. -All his other home medications have been stopped.  Patient not in a condition medically to take any oral medications  -Will be discharged to hospice home today   DISCHARGE CONDITIONS:   Critical  CONSULTS OBTAINED:   Palliative care consult  DRUG ALLERGIES:   Allergies  Allergen Reactions  . Eggs Or Egg-Derived Products Nausea And Vomiting   DISCHARGE MEDICATIONS:   Allergies as of 10/22/2017      Reactions   Eggs Or Egg-derived Products Nausea And Vomiting      Medication List    STOP taking these medications   aspirin 81 MG chewable tablet   atorvastatin 20 MG tablet Commonly known as:  LIPITOR   feeding supplement (PRO-STAT SUGAR FREE 64) Liqd   finasteride 5 MG tablet Commonly known as:  PROSCAR   guaiFENesin 100 MG/5ML Soln Commonly known as:  ROBITUSSIN   loperamide 2 MG capsule Commonly known as:  IMODIUM   magnesium hydroxide 400 MG/5ML suspension Commonly known as:  MILK OF MAGNESIA   metoprolol succinate 25 MG 24 hr tablet Commonly known as:  TOPROL-XL   MINTOX REGULAR STRENGTH 200-200-20 MG/5ML suspension Generic drug:  alum & mag hydroxide-simeth   modafinil 100 MG tablet Commonly known as:  PROVIGIL   neomycin-bacitracin-polymyxin Oint Commonly known as:  NEOSPORIN   nitroGLYCERIN 0.4 MG SL tablet Commonly known as:  NITROSTAT   polyethylene glycol packet Commonly known as:  MIRALAX / GLYCOLAX   SENNA S 8.6-50 MG tablet Generic drug:  senna-docusate     TAKE these medications   acetaminophen  325 MG tablet Commonly known as:  TYLENOL Take 650 mg by mouth every 4 (four) hours as needed for mild pain or moderate pain. What changed:  Another medication with the same name was removed. Continue taking this medication, and follow the directions you see here.   LORazepam 0.5 MG tablet Commonly known as:  ATIVAN Take 1 tablet (0.5 mg total) by mouth every 8 (eight) hours as needed for anxiety.   morphine 20  MG/ML concentrated solution Commonly known as:  ROXANOL Take 0.5 mLs (10 mg total) by mouth every 3 (three) hours as needed for severe pain or shortness of breath.        DISCHARGE INSTRUCTIONS:   1. Will be discharged to hospice home  DIET:   As tolerated  ACTIVITY:   Bedrest  OXYGEN:   Home Oxygen:  No Oxygen Delivery: room air  DISCHARGE LOCATION:   Hospice Home   If you experience worsening of your admission symptoms, develop shortness of breath, life threatening emergency, suicidal or homicidal thoughts you must seek medical attention immediately by calling 911 or calling your MD immediately  if symptoms less severe.  You Must read complete instructions/literature along with all the possible adverse reactions/side effects for all the Medicines you take and that have been prescribed to you. Take any new Medicines after you have completely understood and accpet all the possible adverse reactions/side effects.   Please note  You were cared for by a hospitalist during your hospital stay. If you have any questions about your discharge medications or the care you received while you were in the hospital after you are discharged, you can call the unit and asked to speak with the hospitalist on call if the hospitalist that took care of you is not available. Once you are discharged, your primary care physician will handle any further medical issues. Please note that NO REFILLS for any discharge medications will be authorized once you are discharged, as it is imperative that you return to your primary care physician (or establish a relationship with a primary care physician if you do not have one) for your aftercare needs so that they can reassess your need for medications and monitor your lab values.    On the day of Discharge:  VITAL SIGNS:   Blood pressure 116/76, pulse 90, temperature 98.9 F (37.2 C), temperature source Axillary, resp. rate 20, weight 42.5 kg, SpO2 100  %.  PHYSICAL EXAMINATION:      GENERAL:  84 y.o.-year-old elderly patient lying in the bed with no acute distress.  Critically ill appearing EYES: Pupils equal, round, reactive to light and accommodation. No scleral icterus. Marland Kitchen  HEENT: Head atraumatic, normocephalic. Oropharynx and nasopharynx clear.  Dry mucous membranes NECK:  Supple, no jugular venous distention. No thyroid enlargement, no tenderness.  LUNGS: Normal breath sounds bilaterally, no wheezing, rales,rhonchi or crepitation. No use of accessory muscles of respiration.  Decreased bibasilar breath sounds CARDIOVASCULAR: S1, S2 normal. No rubs, or gallops.  6 systolic murmur is present ABDOMEN: Soft, non-tender, non-distended. Bowel sounds present. No organomegaly or mass.  EXTREMITIES: No pedal edema, cyanosis, or clubbing.  NEUROLOGIC: Patient is obtunded and not responding to stimulation PSYCHIATRIC: The patient is obtunded and not responding to stimulation SKIN: No obvious rash, lesion, or ulcer.   DATA REVIEW:   CBC Recent Labs  Lab 10/21/17 1717  WBC 8.2  HGB 13.0  HCT 38.3*  PLT 253    Chemistries  Recent Labs  Lab 10/21/17 1717  NA 136  K 5.4*  CL 98  CO2 31  GLUCOSE 110*  BUN 12  CREATININE 1.18  CALCIUM 8.6*     Microbiology Results  Results for orders placed or performed during the hospital encounter of 10/21/17  MRSA PCR Screening     Status: None   Collection Time: 10/22/17 12:05 AM  Result Value Ref Range Status   MRSA by PCR NEGATIVE NEGATIVE Final    Comment:        The GeneXpert MRSA Assay (FDA approved for NASAL specimens only), is one component of a comprehensive MRSA colonization surveillance program. It is not intended to diagnose MRSA infection nor to guide or monitor treatment for MRSA infections. Performed at [facility], [address] [place], [place] [address]     RADIOLOGY:  Ct Head Wo Contrast  Result Date: 10/21/2017 CLINICAL DATA:  Pt had an  unwitenssed fall at [employer]. Pt has injury (hematoma, bloody) to left side of head. No other injuries noted. Pt is not verbal at this time. Responsive to tactile stimuli. Pt ambulatory and verbal at baseline per ems report. EXAM: CT HEAD WITHOUT CONTRAST CT CERVICAL SPINE WITHOUT CONTRAST TECHNIQUE: Multidetector CT imaging of the head and cervical spine was performed following the standard protocol without intravenous contrast. Multiplanar CT image reconstructions of the cervical spine were also generated. COMPARISON:  02/19/2017 FINDINGS: CT HEAD FINDINGS Brain: There is acute subarachnoid hemorrhage along the quadrigeminal plate cistern. There is a small area of parenchymal contusion along the inferolateral right temporal lobe. No other acute intracranial hemorrhage. Ventricles normal in configuration. There is ventricular and sulcal enlargement reflecting mild generalized atrophy. This is stable. No convincing hydrocephalus. No evidence of an ischemic infarct. Vascular: No hyperdense vessel or unexpected calcification. Skull: Normal. Negative for fracture or focal lesion. Sinuses/Orbits: Globes and orbits are unremarkable. Dependent fluid is seen in the left maxillary sinus, posterior left ethmoid air cells and right sphenoid sinus. Mild left ethmoid sinus mucosal thickening. Other: Left scalp hematoma. CT CERVICAL SPINE FINDINGS Alignment: Normal. Skull base and vertebrae: No acute fracture. No primary bone lesion or focal pathologic process. Soft tissues and spinal canal: No prevertebral fluid or swelling. No visible canal hematoma. Disc levels: There is biconcave depressions of vertebral endplates throughout the cervical spine, which appears degenerative in origin. Degenerative facet disease noted bilaterally greatest on the right at C4-C5 and left C5-C6. No convincing disc herniation. Skeletal structures are diffusely demineralized Upper chest: No soft tissue masses or adenopathy. Lung apices  demonstrate pleuroparenchymal scarring mild bronchiectasis and areas of bronchial mucous plugging at the right apex. No convincing acute finding. Other: None. IMPRESSION: HEAD CT 1. Small amount of acute subarachnoid hemorrhage is noted in the quadrigeminal plate cistern at and just below the tentorial incisura. 2. Small area of parenchymal contusion along the inferolateral right temporal lobe. 3. No other acute intracranial abnormality. 4. Left scalp hematoma.  No skull fracture. CERVICAL CT 1. No fracture or acute finding. Critical Value/emergent results were called by telephone at the time of interpretation on 10/21/2017 at 6:03 pm to Dr. Nita Sickle , who verbally acknowledged these results. Electronically Signed   By: Amie Portland M.D.   On: 10/21/2017 18:00   Ct Cervical Spine Wo Contrast  Result Date: 10/21/2017 CLINICAL DATA:  Pt had an unwitenssed fall at [employer]. Pt has injury (hematoma, bloody) to left side of head. No other injuries noted. Pt is not verbal at this time. Responsive to tactile stimuli. Pt  ambulatory and verbal at baseline per ems report. EXAM: CT HEAD WITHOUT CONTRAST CT CERVICAL SPINE WITHOUT CONTRAST TECHNIQUE: Multidetector CT imaging of the head and cervical spine was performed following the standard protocol without intravenous contrast. Multiplanar CT image reconstructions of the cervical spine were also generated. COMPARISON:  02/19/2017 FINDINGS: CT HEAD FINDINGS Brain: There is acute subarachnoid hemorrhage along the quadrigeminal plate cistern. There is a small area of parenchymal contusion along the inferolateral right temporal lobe. No other acute intracranial hemorrhage. Ventricles normal in configuration. There is ventricular and sulcal enlargement reflecting mild generalized atrophy. This is stable. No convincing hydrocephalus. No evidence of an ischemic infarct. Vascular: No hyperdense vessel or unexpected calcification. Skull: Normal. Negative for fracture  or focal lesion. Sinuses/Orbits: Globes and orbits are unremarkable. Dependent fluid is seen in the left maxillary sinus, posterior left ethmoid air cells and right sphenoid sinus. Mild left ethmoid sinus mucosal thickening. Other: Left scalp hematoma. CT CERVICAL SPINE FINDINGS Alignment: Normal. Skull base and vertebrae: No acute fracture. No primary bone lesion or focal pathologic process. Soft tissues and spinal canal: No prevertebral fluid or swelling. No visible canal hematoma. Disc levels: There is biconcave depressions of vertebral endplates throughout the cervical spine, which appears degenerative in origin. Degenerative facet disease noted bilaterally greatest on the right at C4-C5 and left C5-C6. No convincing disc herniation. Skeletal structures are diffusely demineralized Upper chest: No soft tissue masses or adenopathy. Lung apices demonstrate pleuroparenchymal scarring mild bronchiectasis and areas of bronchial mucous plugging at the right apex. No convincing acute finding. Other: None. IMPRESSION: HEAD CT 1. Small amount of acute subarachnoid hemorrhage is noted in the quadrigeminal plate cistern at and just below the tentorial incisura. 2. Small area of parenchymal contusion along the inferolateral right temporal lobe. 3. No other acute intracranial abnormality. 4. Left scalp hematoma.  No skull fracture. CERVICAL CT 1. No fracture or acute finding. Critical Value/emergent results were called by telephone at the time of interpretation on 10/21/2017 at 6:03 pm to Dr. Nita Sickle , who verbally acknowledged these results. Electronically Signed   By: Amie Portland M.D.   On: 10/21/2017 18:00   Dg Shoulder Left  Result Date: 10/21/2017 CLINICAL DATA:  Left shoulder pain and bruising after fall today. EXAM: LEFT SHOULDER - 2+ VIEW COMPARISON:  None. FINDINGS: There is mild displaced fracture of the lateral left third rib. Degenerative joint changes of the left shoulder are noted. IMPRESSION:  Fracture of the lateral left third rib. Electronically Signed   By: Sherian Rein M.D.   On: 10/21/2017 18:40     Management plans discussed with the patient, family and they are in agreement.  CODE STATUS:     Code Status Orders  (From admission, onward)         Start     Ordered   10/21/17 1916  Do not attempt resuscitation (DNR)  Continuous    Question Answer Comment  In the event of cardiac or respiratory ARREST Do not call a "code blue"   In the event of cardiac or respiratory ARREST Do not perform Intubation, CPR, defibrillation or ACLS   In the event of cardiac or respiratory ARREST Use medication by any route, position, wound care, and other measures to relive pain and suffering. May use oxygen, suction and manual treatment of airway obstruction as needed for comfort.      10/21/17 1916        Code Status History    This patient has a current  code status but no historical code status.    Advance Directive Documentation     Most Recent Value  Type of Advance Directive  Out of facility DNR (pink MOST or yellow form), Healthcare Power of Attorney  Pre-existing out of facility DNR order (yellow form or pink MOST form)  Yellow form placed in chart (order not valid for inpatient use)  "MOST" Form in Place?  -      TOTAL TIME TAKING CARE OF THIS PATIENT: 38 minutes.    Enid Baas M.D on 10/22/2017 at 12:49 PM  Between 7am to 6pm - Pager - [phone]  After 6pm go to [email or web address] - Social research officer, government  [facility] [place] [facility]  Office  [phone]  CC: Primary care physician; Marguarite Arbour, MD   Note: This dictation was prepared with Dragon dictation along with smaller phrase technology. Any transcriptional errors that result from this process are unintentional.

## 2017-10-22 NOTE — Progress Notes (Signed)
Pt D/C to Hospice via EMS. IVs left in place. Condom catheter left on. All questions answered to family.

## 2017-10-22 NOTE — Clinical Social Work Note (Signed)
Clinical Social Work Assessment  Patient Details  Name: Eric [name] MRN: [id number] Date of Birth: 1923/04/19  Date of referral:  10/22/17               Reason for consult:  Facility Placement                Permission sought to share information with:  Case Manager, Customer service manager, Family Supports Permission granted to share information::  Yes, Verbal Permission Granted  Name::        Agency::     Relationship::     Contact Information:     Housing/Transportation Living arrangements for the past 2 months:  [place] of Information:  Adult Children Patient Interpreter Needed:  None Criminal Activity/Legal Involvement Pertinent to Current Situation/Hospitalization:  No - Comment as needed Significant Relationships:  Adult Children Lives with:  Facility Resident Do you feel safe going back to the place where you live?  Yes Need for family participation in patient care:  Yes (Comment)  Care giving concerns:  Patient is a long term resident at [facility] care.    Social Worker assessment / plan:  CSW consulted for facility placement. CSW met with patient's daughter [name] at bedside. [name] states that patient has been a resident at [facility] since February of 2019. [name] also states that patient is followed by [facility]. Patient is asleep at this time. Daughter states that if patient wakes up and is more arouseable she would like him to return to [facility] but if he does not wake up then she would like him transferred to Hospice home. CSW notified Marcene Brawn, hospice liaison of above. CSW will follow for discharge planning.   Employment status:  Retired Forensic scientist:  Other (Comment Required)(Hospice ) PT Recommendations:  Not assessed at this time Information / Referral to community resources:     Patient/Family's Response to care:  Daughter thanked CSW for assistance   Patient/Family's  Understanding of and Emotional Response to Diagnosis, Current Treatment, and Prognosis:  Daughter is aware that patient is likely end of life and needs additional care.   Emotional Assessment Appearance:  Appears stated age Attitude/Demeanor/Rapport:  Unable to Assess Affect (typically observed):  Unable to Assess Orientation:    Alcohol / Substance use:  Not Applicable Psych involvement (Current and /or in the community):  No (Comment)  Discharge Needs  Concerns to be addressed:  Discharge Planning Concerns Readmission within the last 30 days:  No Current discharge risk:  None Barriers to Discharge:  Continued Medical Work up   [employer], [place] 10/22/2017, 11:27 AM

## 2017-10-24 LAB — URINE CULTURE: Culture: >=100000 — AB

## 2017-11-21 DEATH — deceased

## 2018-01-08 ENCOUNTER — Ambulatory Visit: Payer: Medicare Other | Admitting: Urology

## 2018-10-03 IMAGING — CR DG HIP (WITH OR WITHOUT PELVIS) 2-3V*L*
1 series · 3 of 3 positions shown · non-contrast
Comparison: 02/15/2017

CLINICAL DATA: Found on floor.  Left hip pain.  Initial encounter.

EXAM:
DG HIP (WITH OR WITHOUT PELVIS) 2-3V LEFT

[Series 1: dg hip unilat w or w/o pelvis 2-3 views  · non-contrast · 0.14mm/px · 3 of 3 slices shown]
[im 1/3]
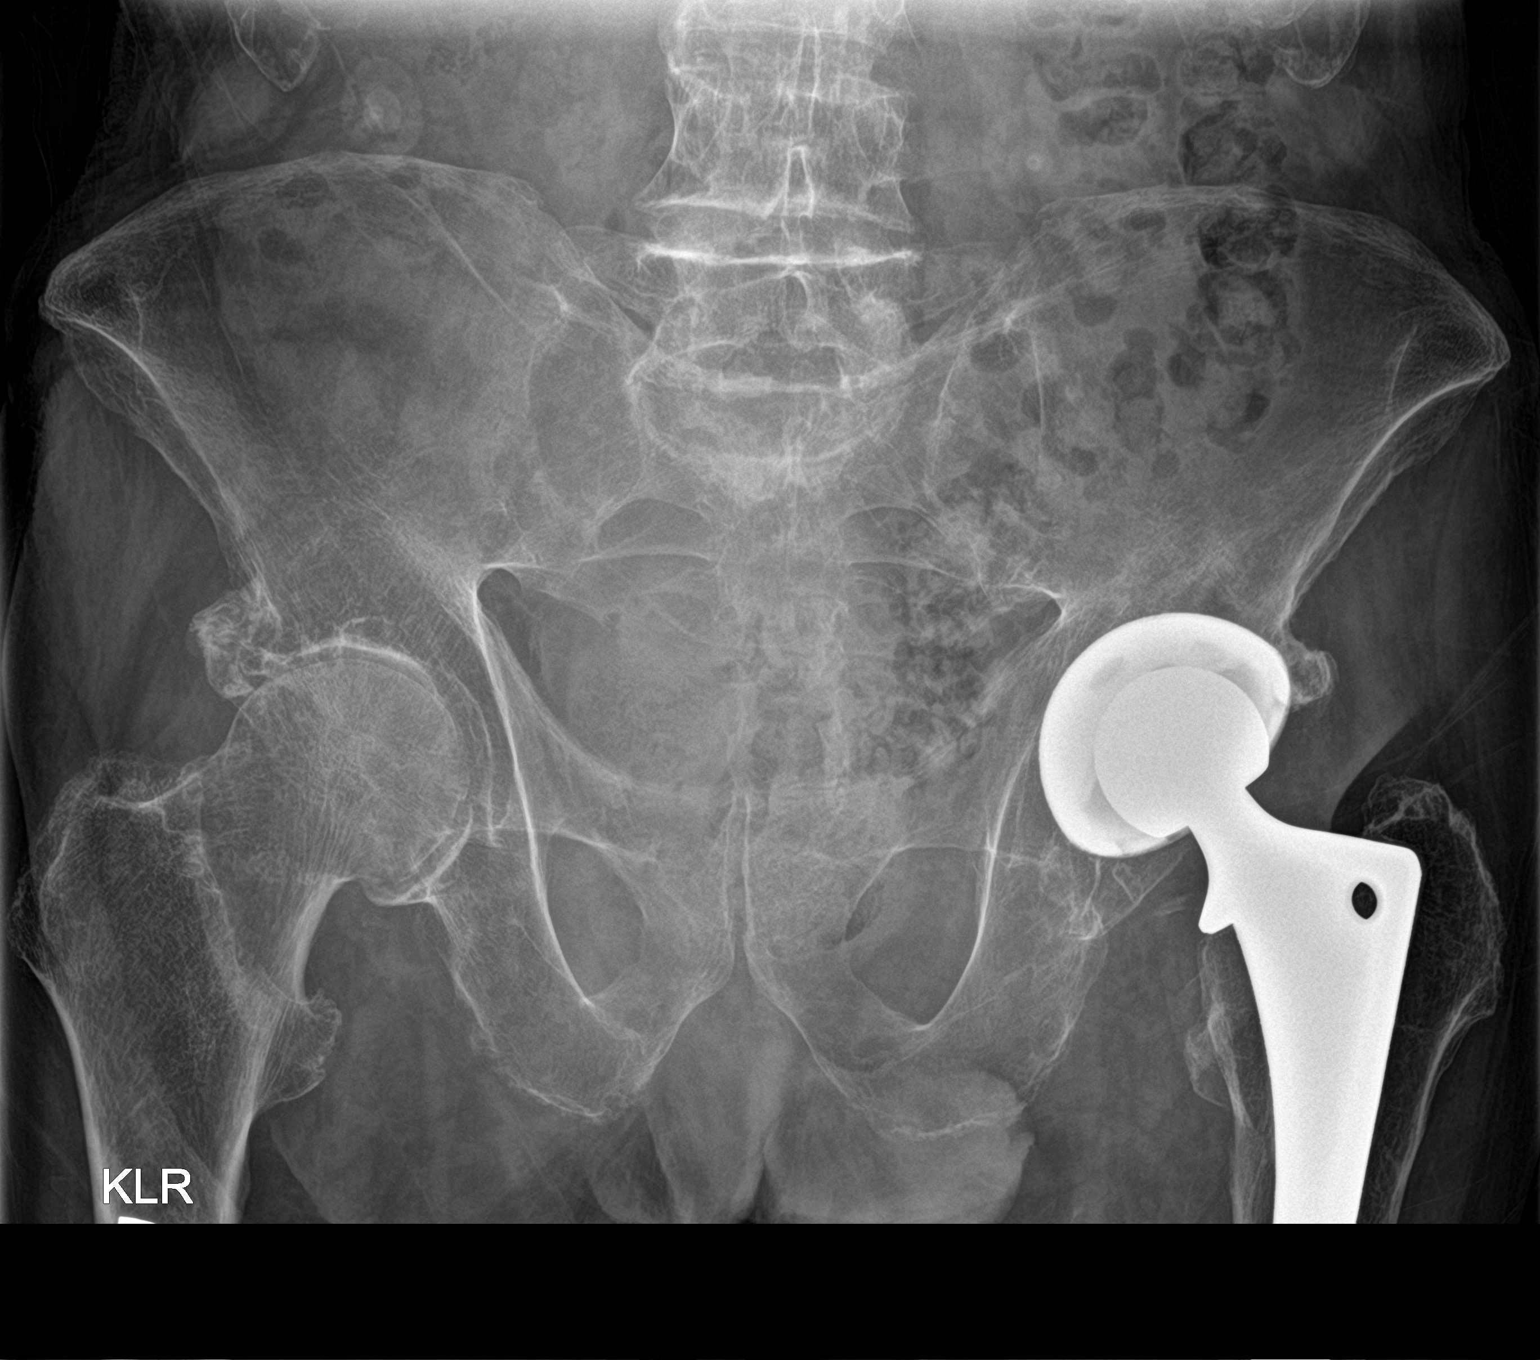
[im 2/3]
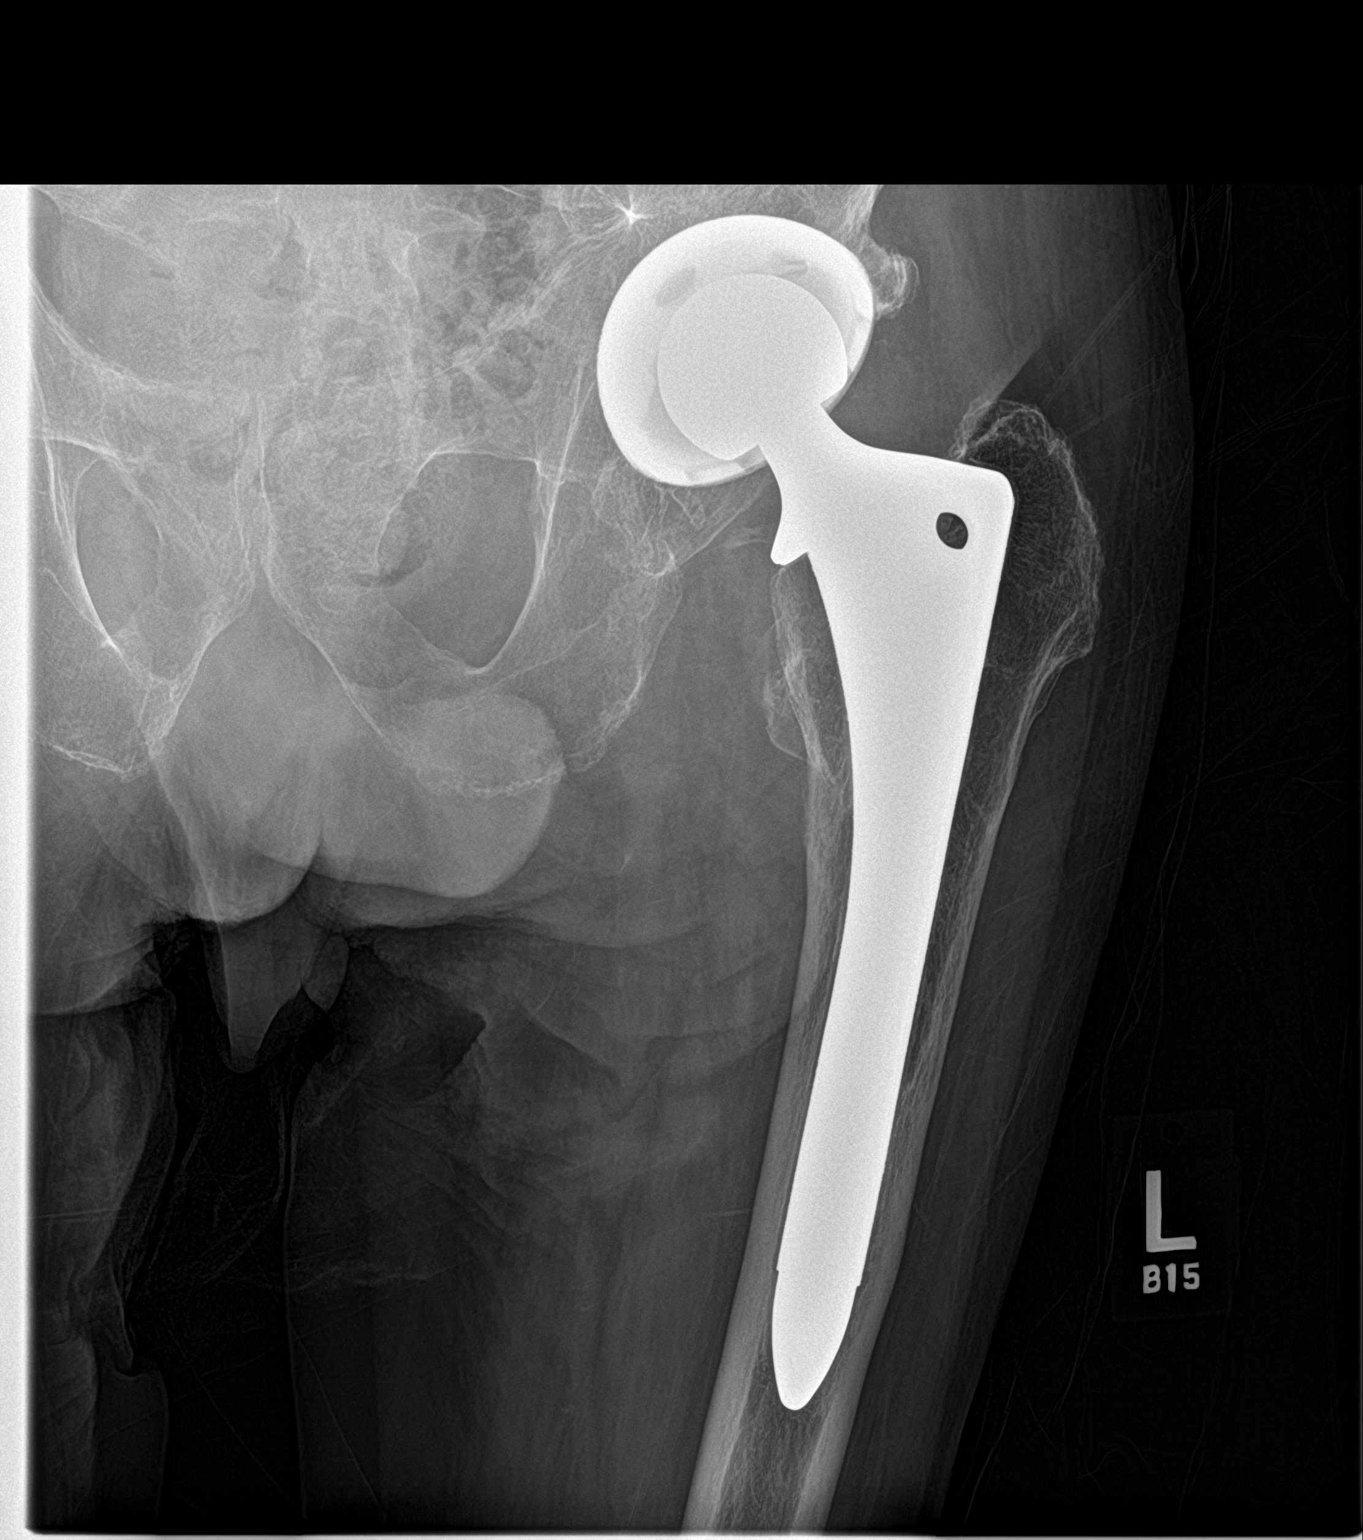
[im 3/3]
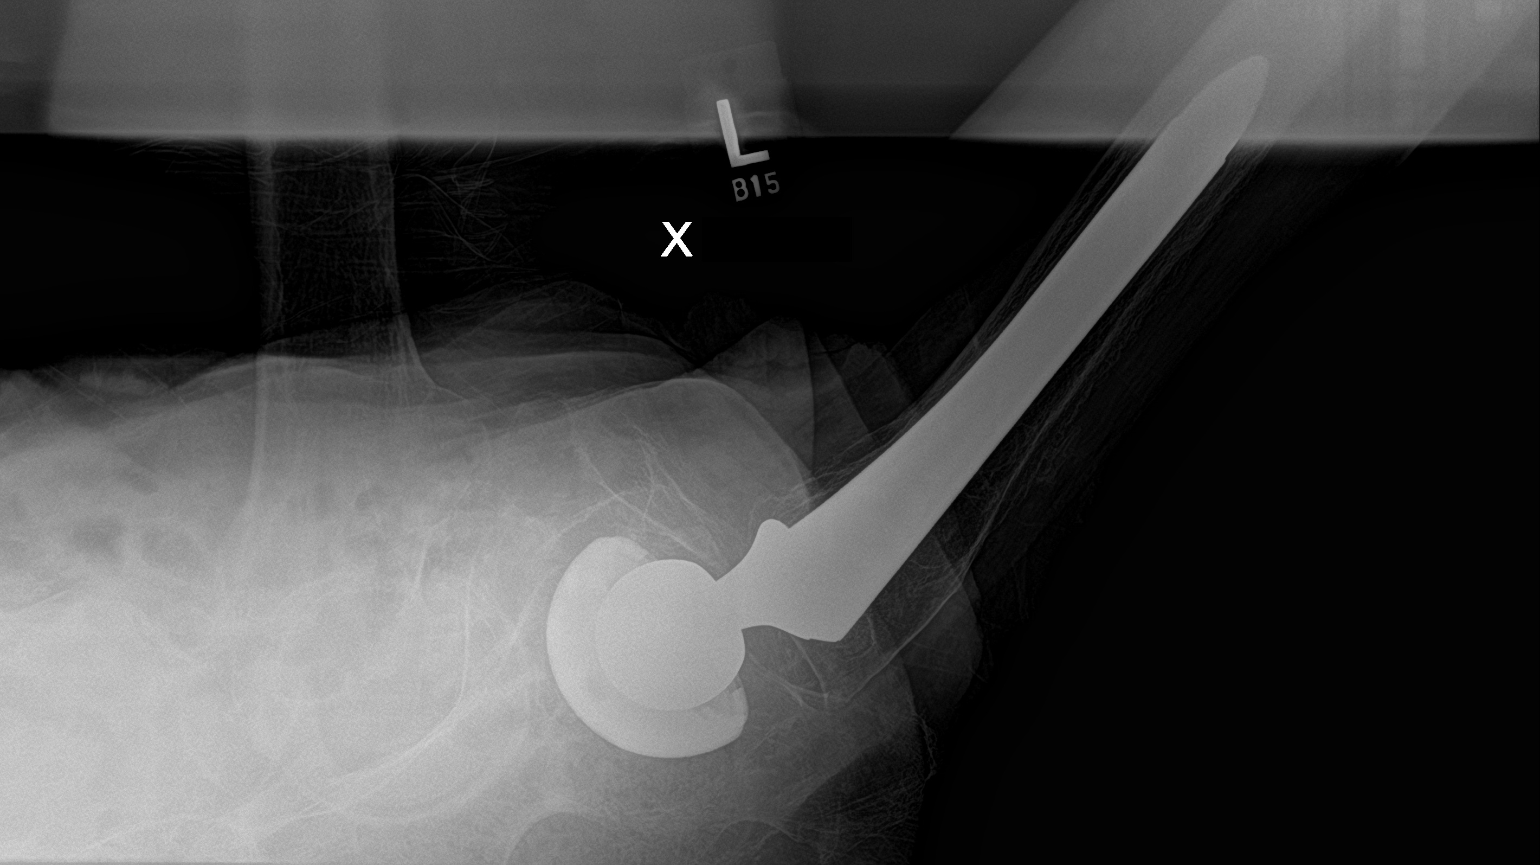

[3 of 3 positions shown; findings below may reference images not displayed]

FINDINGS: Total left hip arthroplasty that is located. No evidence of
periprosthetic fracture. No evidence of pelvis fracture or
diastasis. The sacroiliac joints appear fused. Right hip
osteoarthritis with spurring and joint narrowing. Osteopenia.
IMPRESSION: No acute finding or change from prior.

## 2018-10-03 IMAGING — CR DG LUMBAR SPINE COMPLETE 4+V
1 series · 5 of 5 positions shown · non-contrast
Comparison: None.

CLINICAL DATA: Fell.

EXAM:
LUMBAR SPINE - COMPLETE 4+ VIEW

[Series 1: dg lumbar spine complete 4 +v · 0.14mm/px · 5 of 5 slices shown]
[im 1/5]
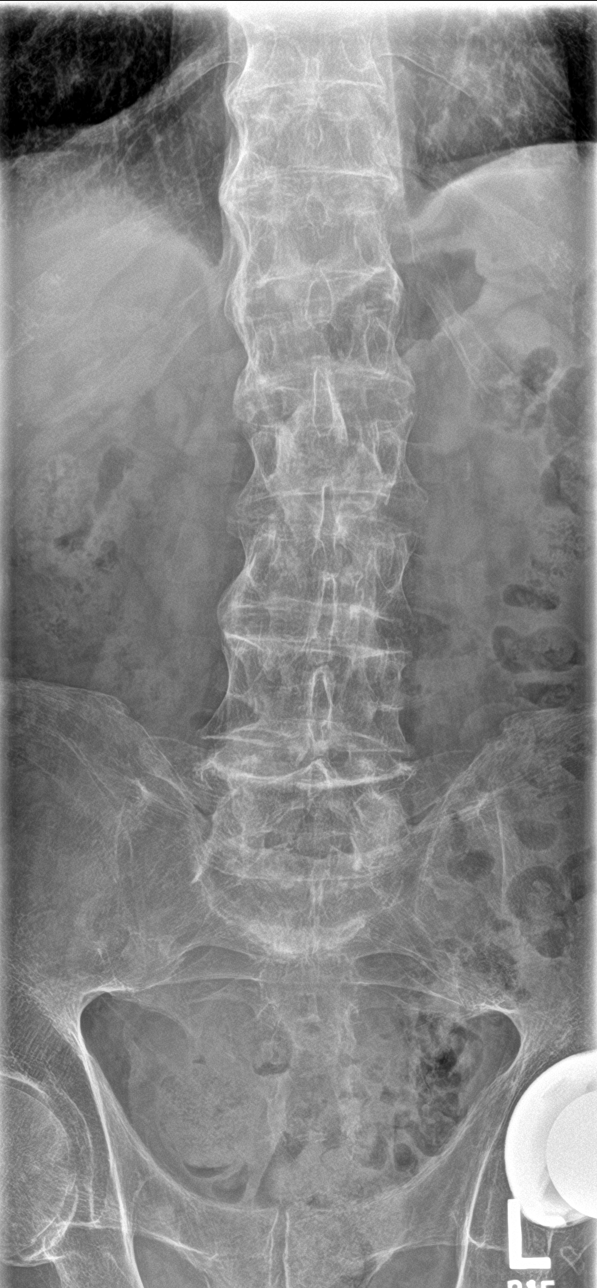
[im 2/5]
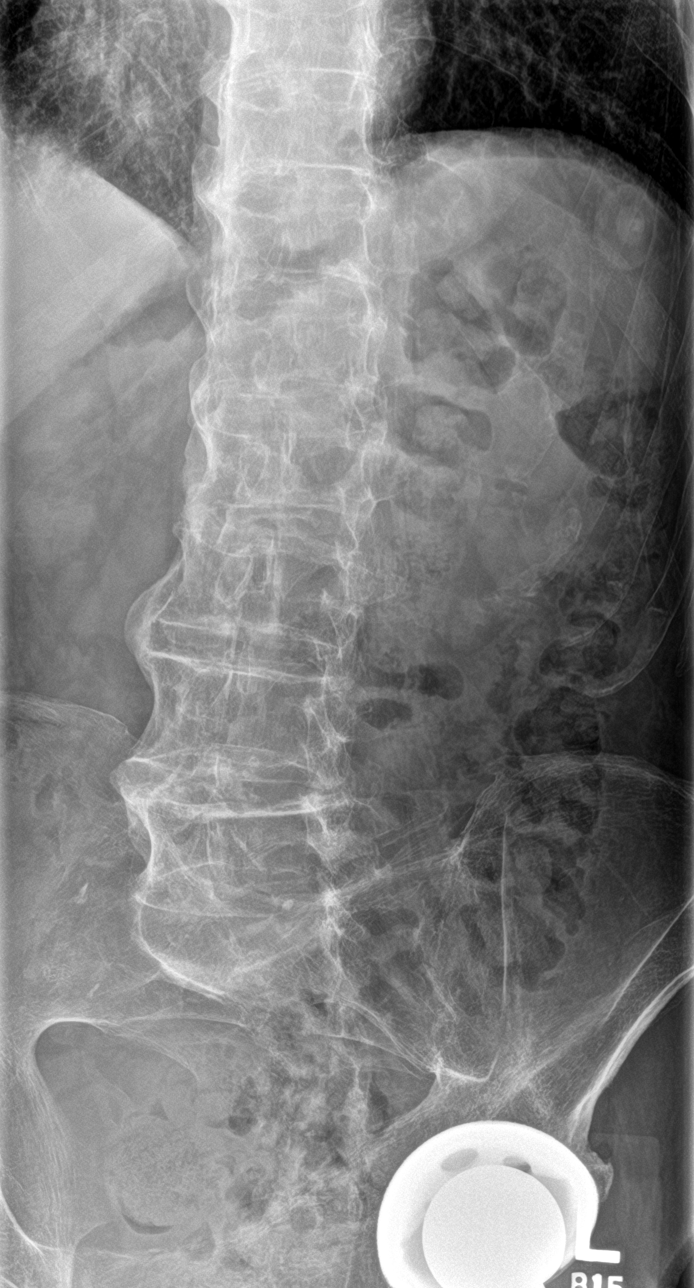
[im 3/5]
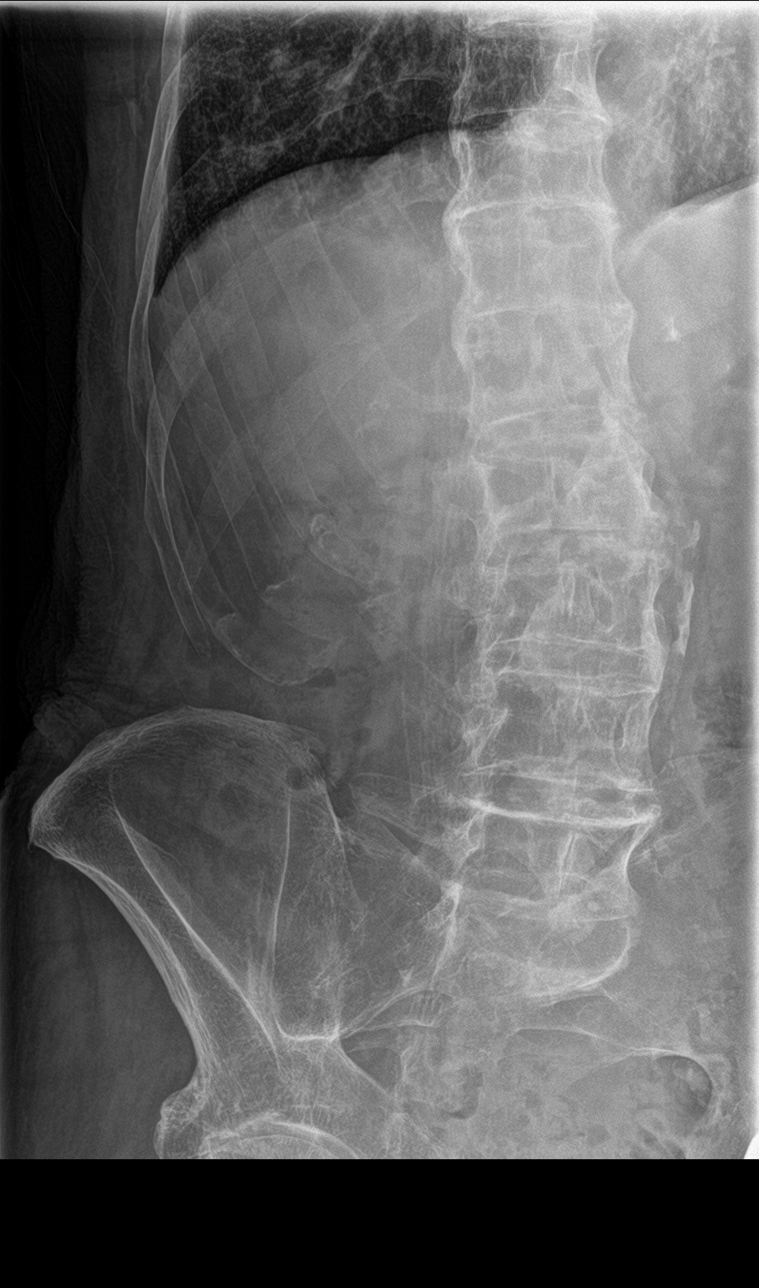
[im 4/5]
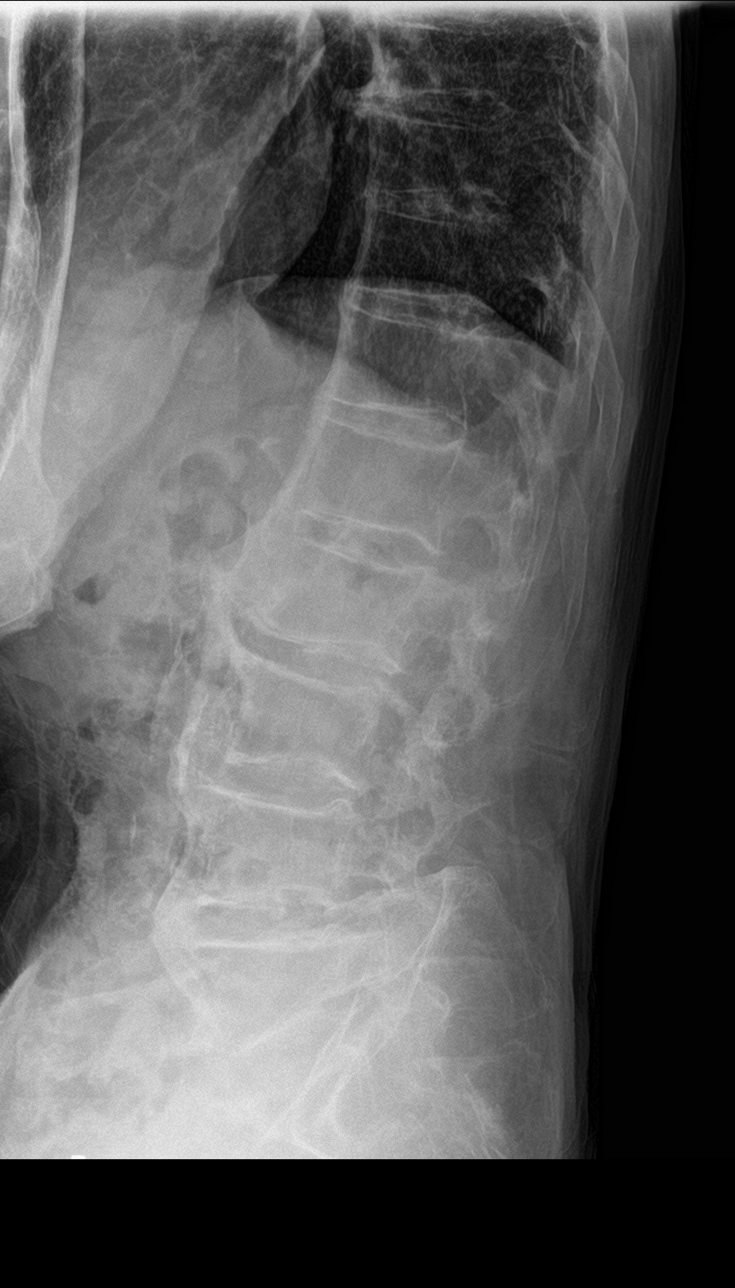
[im 5/5]
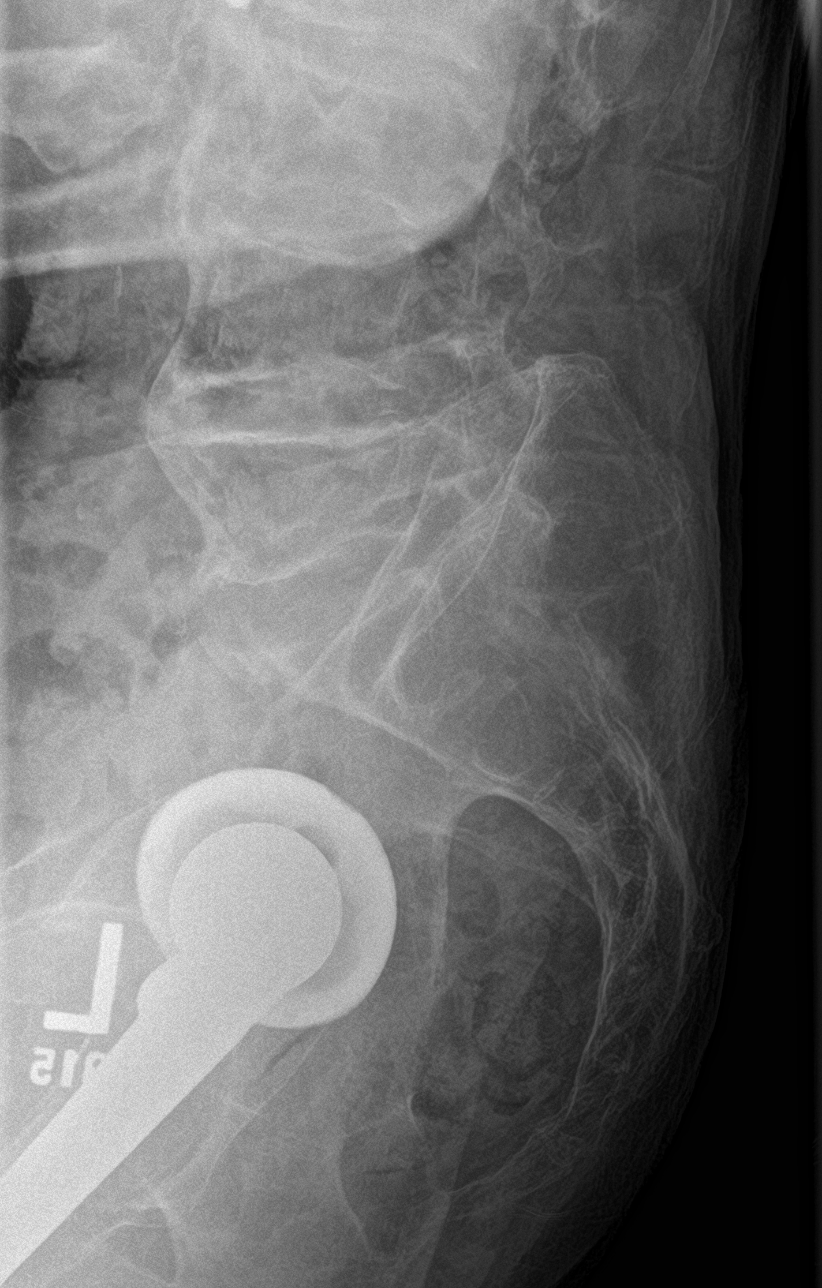

[5 of 5 positions shown; findings below may reference images not displayed]

FINDINGS: Normal alignment on the lateral film. Suspect changes of ankylosing
spondylitis or DISH. No acute fracture. The facets are normally
aligned. No obvious pars defects. The visualized bony pelvis is
grossly intact. A left hip prosthesis is noted. Advanced
degenerative changes involving the right hip.
IMPRESSION: Normal alignment and no acute bony findings.

## 2019-06-04 IMAGING — CT CT CERVICAL SPINE W/O CM
4 of 8 series · 11 of 33 positions shown, 12 images · non-contrast
Comparison: 02/19/2017

CLINICAL DATA: Pt had an unwitenssed fall at [HOSPITAL] house. Pt has
injury (hematoma, bloody) to left side of head. No other injuries
noted. Pt is not verbal at this time. Responsive to tactile stimuli.
Pt ambulatory and verbal at baseline per ems report.

EXAM:
CT HEAD WITHOUT CONTRAST
CT CERVICAL SPINE WITHOUT CONTRAST
TECHNIQUE: Multidetector CT imaging of the head and cervical spine was
performed following the standard protocol without intravenous
contrast. Multiplanar CT image reconstructions of the cervical spine
were also generated.

[Series 7: c spine soft · axial · 0.32mm/px · z∈[-250,-182]mm · 3 of 69 slices shown]
[im 18/69  soft-tissue]
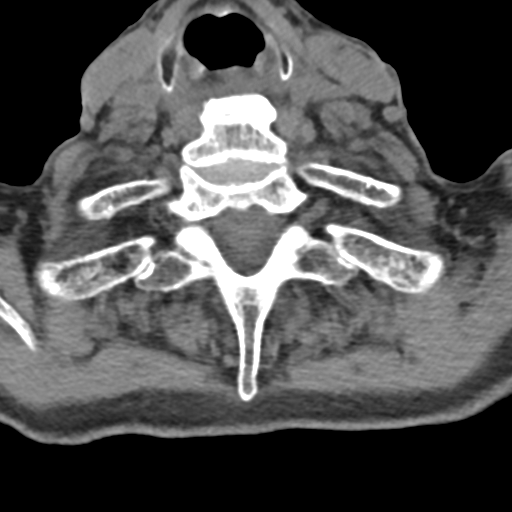
[im 35/69  soft-tissue]
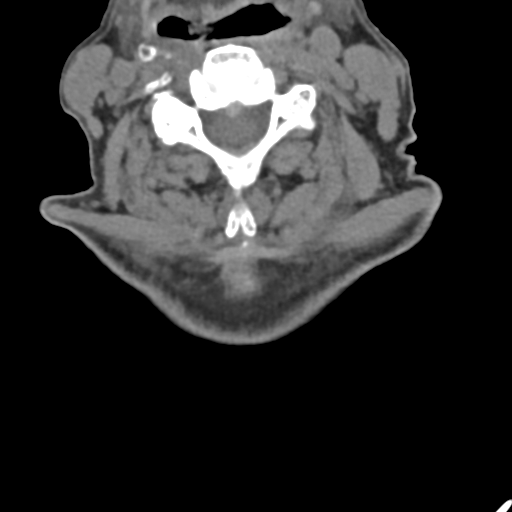
[im 52/69  soft-tissue]
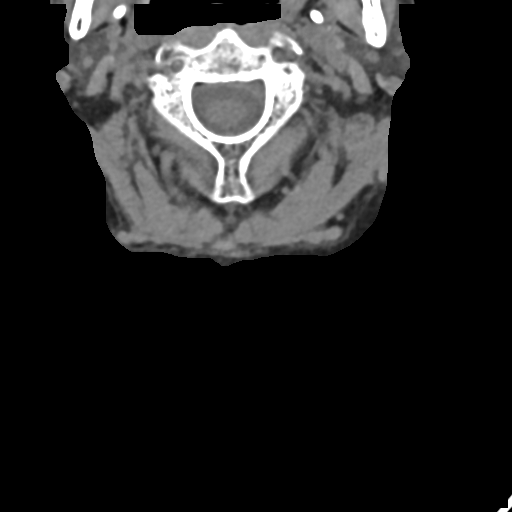

[Series 8: sagittal bone · sagittal · 0.29mm/px · 4 of 46 slices shown]
[im 10/46  bone]
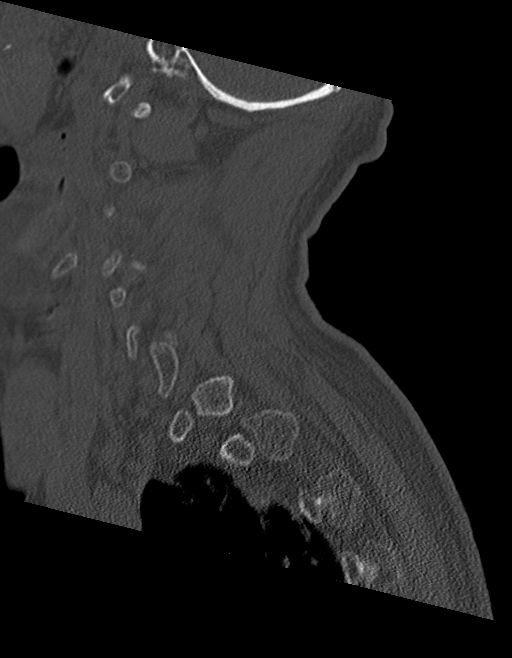
[im 19/46  bone]
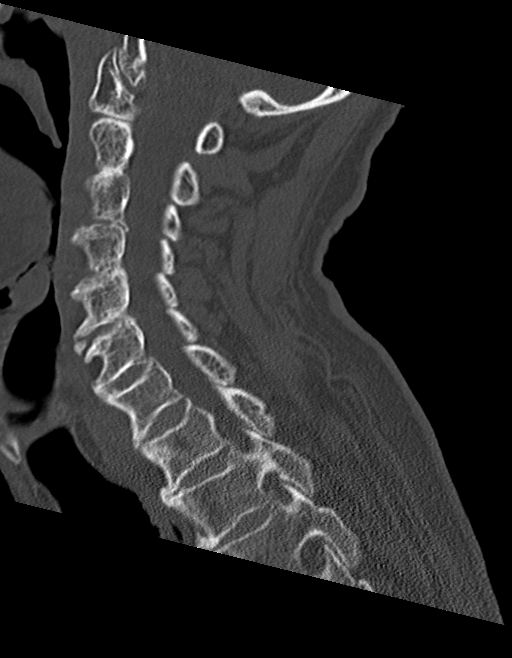
[im 28/46  bone]
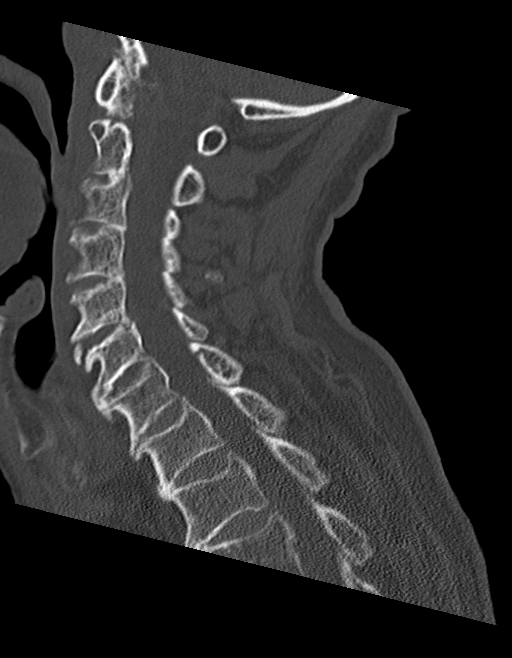
[im 37/46  bone]
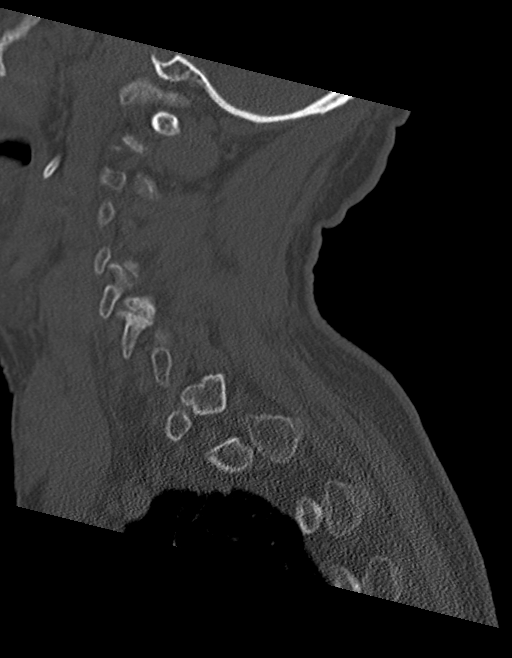

[Series 9: coronal bone · coronal · 0.19mm/px · 1 of 48 slices shown]
[im 24/48  bone]
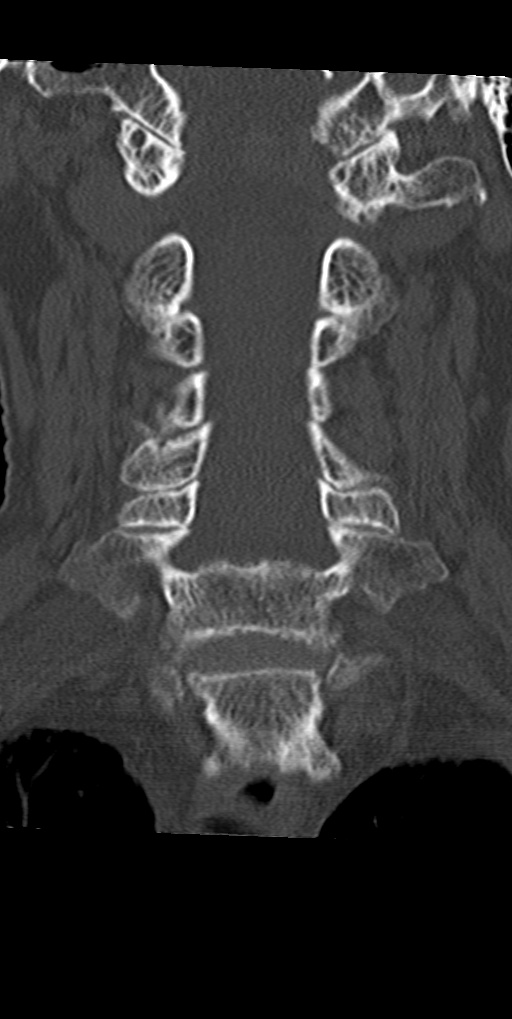

[Series 10: orthogonal bone · axial · 0.18mm/px · z∈[-268,-195]mm · 3 of 76 slices shown, 4 images]
[im 19/76  soft-tissue]
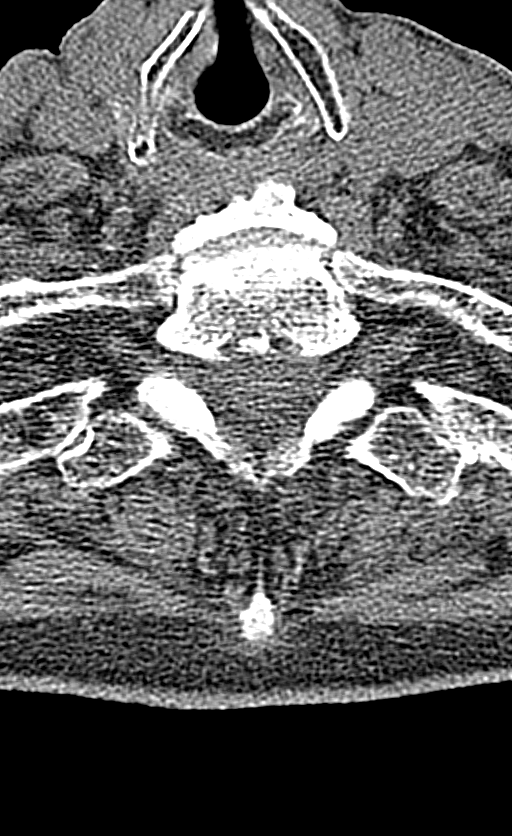
[im 19/76  bone]
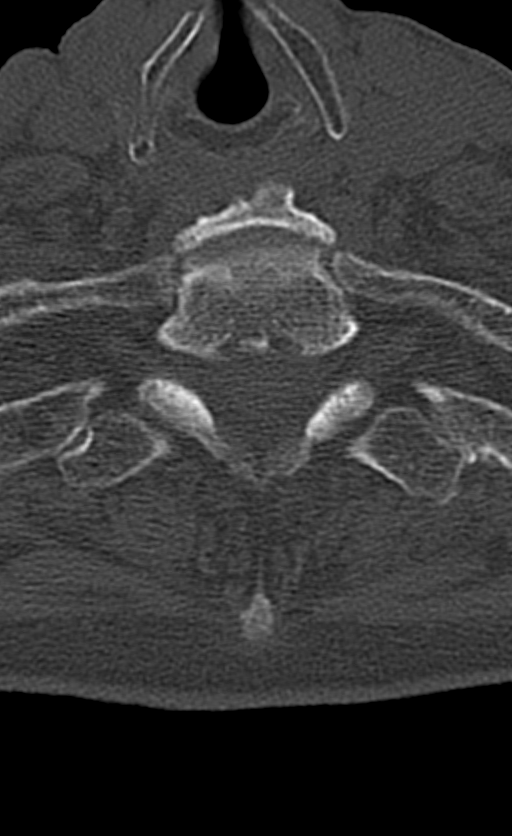
[im 38/76  bone]
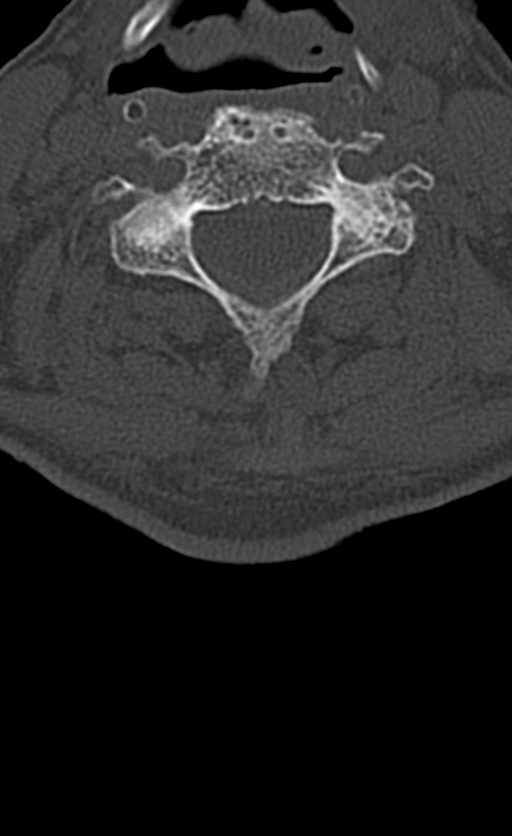
[im 57/76  bone]
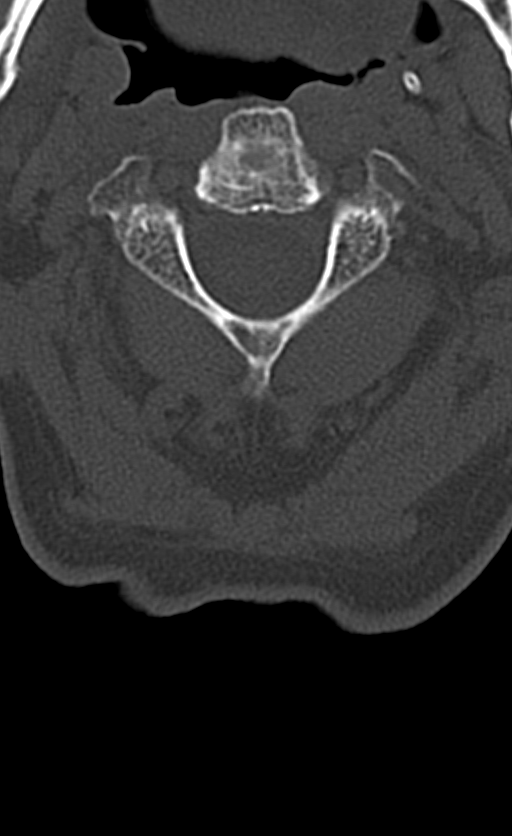

[11 of 33 positions shown; findings below may reference images not displayed]

FINDINGS: CT HEAD FINDINGS

Brain: There is acute subarachnoid hemorrhage along the
quadrigeminal plate cistern. There is a small area of parenchymal
contusion along the inferolateral right temporal lobe.

No other acute intracranial hemorrhage.

Ventricles normal in configuration. There is ventricular and sulcal
enlargement reflecting mild generalized atrophy. This is stable. No
convincing hydrocephalus.

No evidence of an ischemic infarct.

Vascular: No hyperdense vessel or unexpected calcification.

Skull: Normal. Negative for fracture or focal lesion.

Sinuses/Orbits: Globes and orbits are unremarkable.

Dependent fluid is seen in the left maxillary sinus, posterior left
ethmoid air cells and right sphenoid sinus. Mild left ethmoid sinus
mucosal thickening.

Other: Left scalp hematoma.

CT CERVICAL SPINE FINDINGS

Alignment: Normal.

Skull base and vertebrae: No acute fracture. No primary bone lesion
or focal pathologic process.

Soft tissues and spinal canal: No prevertebral fluid or swelling. No
visible canal hematoma.

Disc levels: There is biconcave depressions of vertebral endplates
throughout the cervical spine, which appears degenerative in origin.
Degenerative facet disease noted bilaterally greatest on the right
at C4-C5 and left C5-C6. No convincing disc herniation. Skeletal
structures are diffusely demineralized

Upper chest: No soft tissue masses or adenopathy. Lung apices
demonstrate pleuroparenchymal scarring mild bronchiectasis and areas
of bronchial mucous plugging at the right apex. No convincing acute
finding.

Other: None.
IMPRESSION: HEAD CT

1. Small amount of acute subarachnoid hemorrhage is noted in the
quadrigeminal plate cistern at and just below the tentorial
incisura.
2. Small area of parenchymal contusion along the inferolateral right
temporal lobe.
3. No other acute intracranial abnormality.
4. Left scalp hematoma.  No skull fracture.

CERVICAL CT

1. No fracture or acute finding.

Critical Value/emergent results were called by telephone at the time
of interpretation on 10/21/2017 at [DATE] to Dr. [HOSPITAL] KURIAN ,
who verbally acknowledged these results.
